# Patient Record
Sex: Female | Born: 1985 | Race: Black or African American | Hispanic: No | Smoking: Former smoker
Health system: Southern US, Community
[De-identification: ages and names within clinical notes are randomized; demographics above are authoritative.]

## PROBLEM LIST (undated history)

## (undated) ENCOUNTER — Emergency Department: Admission: EM | Discharge status: Absent without leave | Payer: No Typology Code available for payment source

## (undated) ENCOUNTER — Emergency Department: Payer: Medicaid Other

## (undated) DIAGNOSIS — O1490 Unspecified pre-eclampsia, unspecified trimester: Secondary | ICD-10-CM

## (undated) HISTORY — PX: DILATION AND CURETTAGE OF UTERUS: SHX78

## (undated) HISTORY — DX: Unspecified pre-eclampsia, unspecified trimester: O14.90

---
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
## 2007-02-09 DIAGNOSIS — O1490 Unspecified pre-eclampsia, unspecified trimester: Secondary | ICD-10-CM

## 2007-02-09 HISTORY — DX: Unspecified pre-eclampsia, unspecified trimester: O14.90

## 2007-08-16 ENCOUNTER — Inpatient Hospital Stay: Payer: Self-pay | Admitting: Obstetrics and Gynecology

## 2008-06-04 ENCOUNTER — Emergency Department: Payer: Self-pay | Admitting: Emergency Medicine

## 2009-07-07 ENCOUNTER — Emergency Department: Payer: Self-pay | Admitting: Internal Medicine

## 2009-07-08 ENCOUNTER — Emergency Department: Payer: Self-pay | Admitting: Emergency Medicine

## 2009-12-23 ENCOUNTER — Observation Stay: Payer: Self-pay | Admitting: Obstetrics and Gynecology

## 2010-01-18 ENCOUNTER — Observation Stay: Payer: Self-pay | Admitting: Obstetrics and Gynecology

## 2010-03-04 ENCOUNTER — Observation Stay: Payer: Self-pay

## 2010-03-07 ENCOUNTER — Observation Stay: Payer: Self-pay | Admitting: Obstetrics and Gynecology

## 2010-03-07 ENCOUNTER — Inpatient Hospital Stay: Payer: Self-pay

## 2010-10-19 ENCOUNTER — Emergency Department: Payer: Self-pay | Admitting: Emergency Medicine

## 2011-01-18 ENCOUNTER — Encounter: Payer: Self-pay | Admitting: Obstetrics & Gynecology

## 2011-03-29 ENCOUNTER — Encounter: Payer: Self-pay | Admitting: Maternal & Fetal Medicine

## 2011-05-25 ENCOUNTER — Inpatient Hospital Stay: Payer: Self-pay

## 2011-05-25 LAB — CBC WITH DIFFERENTIAL/PLATELET
Basophil #: 0 10*3/uL (ref 0.0–0.1)
Eosinophil #: 0.2 10*3/uL (ref 0.0–0.7)
HCT: 34.8 % — ABNORMAL LOW (ref 35.0–47.0)
HGB: 11.1 g/dL — ABNORMAL LOW (ref 12.0–16.0)
Lymphocyte #: 1.7 10*3/uL (ref 1.0–3.6)
Lymphocyte %: 17.4 %
MCHC: 31.8 g/dL — ABNORMAL LOW (ref 32.0–36.0)
Monocyte #: 0.6 x10 3/mm (ref 0.2–0.9)
Neutrophil #: 7.3 10*3/uL — ABNORMAL HIGH (ref 1.4–6.5)
Platelet: 248 10*3/uL (ref 150–440)
RBC: 3.73 10*6/uL — ABNORMAL LOW (ref 3.80–5.20)
RDW: 14.7 % — ABNORMAL HIGH (ref 11.5–14.5)

## 2011-05-27 LAB — HEMATOCRIT: HCT: 33.1 % — ABNORMAL LOW (ref 35.0–47.0)

## 2012-01-10 DIAGNOSIS — IMO0002 Reserved for concepts with insufficient information to code with codable children: Secondary | ICD-10-CM | POA: Insufficient documentation

## 2012-01-10 DIAGNOSIS — R87619 Unspecified abnormal cytological findings in specimens from cervix uteri: Secondary | ICD-10-CM | POA: Insufficient documentation

## 2014-06-18 NOTE — H&P (Signed)
L&D Evaluation:  History:   HPI 29 yo [id number] at 420w0d presenting for scheduled NST today for postdates.  Patient has been seen by HD this pregnancy.  Reports +FM, no LOF, no VB, no ctx.   PNL A+ / ABSC neg / RPR NR / RI / VI / HIV neg / HBsAg neg / ASCUS HPV positive pap / GC/CT neg 03/26/11, GBS positive 04/23/11.  Pregnancy uncomplicated other than occasional EtOH use noted at start of pregnancy, smker, trichomonas positive.    Presents with NST    Patient's Medical History No Chronic Illness    Patient's Surgical History none    Medications Pre Natal Vitamins    Allergies NKDA    Social History tobacco  drugs  prior ecstasy use 07/2010    Family History Non-Contributory   Exam:   Vital Signs stable    General no apparent distress    Mental Status clear    Abdomen gravid, non-tender    Estimated Fetal Weight Average for gestational age    Fetal Position vtx    Pelvic no external lesions, ftp/30/-3    Mebranes Intact    FHT moderate variability, occasional accels, at least 1 variable    Ucx absent   Impression:   Impression NST not quite reactive with variable noted, given postdates and NST not reactive will proceed with IOL   Plan:   Comments - cervadil insert - Amp for GBS ppx   Electronic Signatures: Tasha Macias, Tasha Macias (MD)  (Signed 16-Apr-13 13:30)  Authored: L&D Evaluation   Last Updated: 16-Apr-13 13:30 by Tasha Macias, Tasha Macias (MD)

## 2015-10-16 ENCOUNTER — Emergency Department: Payer: No Typology Code available for payment source

## 2015-10-16 ENCOUNTER — Encounter: Payer: Self-pay | Admitting: Emergency Medicine

## 2015-10-16 ENCOUNTER — Emergency Department
Admission: EM | Admit: 2015-10-16 | Discharge: 2015-10-16 | Disposition: A | Discharge status: Home / Self Care | Payer: No Typology Code available for payment source | Attending: Emergency Medicine | Admitting: Emergency Medicine

## 2015-10-16 DIAGNOSIS — S8011XA Contusion of right lower leg, initial encounter: Secondary | ICD-10-CM

## 2015-10-16 DIAGNOSIS — F17200 Nicotine dependence, unspecified, uncomplicated: Secondary | ICD-10-CM | POA: Insufficient documentation

## 2015-10-16 DIAGNOSIS — S8002XA Contusion of left knee, initial encounter: Secondary | ICD-10-CM | POA: Diagnosis not present

## 2015-10-16 DIAGNOSIS — S161XXA Strain of muscle, fascia and tendon at neck level, initial encounter: Secondary | ICD-10-CM | POA: Insufficient documentation

## 2015-10-16 DIAGNOSIS — Y92410 Unspecified street and highway as the place of occurrence of the external cause: Secondary | ICD-10-CM | POA: Diagnosis not present

## 2015-10-16 DIAGNOSIS — S20219A Contusion of unspecified front wall of thorax, initial encounter: Secondary | ICD-10-CM | POA: Insufficient documentation

## 2015-10-16 DIAGNOSIS — Y9389 Activity, other specified: Secondary | ICD-10-CM | POA: Insufficient documentation

## 2015-10-16 DIAGNOSIS — S199XXA Unspecified injury of neck, initial encounter: Secondary | ICD-10-CM | POA: Diagnosis present

## 2015-10-16 DIAGNOSIS — Y999 Unspecified external cause status: Secondary | ICD-10-CM | POA: Insufficient documentation

## 2015-10-16 DIAGNOSIS — M62838 Other muscle spasm: Secondary | ICD-10-CM

## 2015-10-16 DIAGNOSIS — S8001XA Contusion of right knee, initial encounter: Secondary | ICD-10-CM | POA: Diagnosis not present

## 2015-10-16 DIAGNOSIS — S8012XA Contusion of left lower leg, initial encounter: Secondary | ICD-10-CM

## 2015-10-16 LAB — POCT PREGNANCY, URINE: Preg Test, Ur: NEGATIVE

## 2015-10-16 MED ORDER — NAPROXEN 500 MG PO TABS: 500.0000 mg | ORAL_TABLET | Freq: Two times a day (BID) | ORAL | 0 refills | Status: DC

## 2015-10-16 MED ORDER — CYCLOBENZAPRINE HCL 10 MG PO TABS: 10.0000 mg | ORAL_TABLET | Freq: Three times a day (TID) | ORAL | 0 refills | Status: DC | PRN

## 2015-10-16 MED ORDER — KETOROLAC TROMETHAMINE 30 MG/ML IJ SOLN
30.0000 mg | Freq: Once | INTRAMUSCULAR | Status: AC
  Administered 2015-10-16: 30 mg via INTRAMUSCULAR
  Filled 2015-10-16: qty 1
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
## 2016-03-01 LAB — HM PAP SMEAR: HM Pap smear: NEGATIVE

## 2016-11-12 ENCOUNTER — Ambulatory Visit
Admission: EM | Admit: 2016-11-12 | Discharge: 2016-11-12 | Disposition: A | Discharge status: Home / Self Care | Payer: Self-pay | Attending: Family Medicine | Admitting: Family Medicine

## 2017-07-19 ENCOUNTER — Other Ambulatory Visit: Payer: Self-pay

## 2017-07-19 ENCOUNTER — Emergency Department
Admission: EM | Admit: 2017-07-19 | Discharge: 2017-07-19 | Disposition: A | Discharge status: Home / Self Care | Payer: Self-pay | Attending: Emergency Medicine | Admitting: Emergency Medicine

## 2017-07-19 ENCOUNTER — Encounter: Payer: Self-pay | Admitting: Emergency Medicine

## 2017-07-19 DIAGNOSIS — Y92410 Unspecified street and highway as the place of occurrence of the external cause: Secondary | ICD-10-CM | POA: Insufficient documentation

## 2017-07-19 DIAGNOSIS — Y998 Other external cause status: Secondary | ICD-10-CM | POA: Insufficient documentation

## 2017-07-19 DIAGNOSIS — M25561 Pain in right knee: Secondary | ICD-10-CM | POA: Insufficient documentation

## 2017-07-19 DIAGNOSIS — F17210 Nicotine dependence, cigarettes, uncomplicated: Secondary | ICD-10-CM | POA: Insufficient documentation

## 2017-07-19 DIAGNOSIS — S161XXA Strain of muscle, fascia and tendon at neck level, initial encounter: Secondary | ICD-10-CM | POA: Insufficient documentation

## 2017-07-19 DIAGNOSIS — Y9389 Activity, other specified: Secondary | ICD-10-CM | POA: Insufficient documentation

## 2017-07-19 DIAGNOSIS — M25562 Pain in left knee: Secondary | ICD-10-CM | POA: Insufficient documentation

## 2017-07-19 MED ORDER — CYCLOBENZAPRINE HCL 10 MG PO TABS: 10.0000 mg | ORAL_TABLET | Freq: Three times a day (TID) | ORAL | 0 refills | Status: DC | PRN

## 2017-07-19 MED ORDER — NAPROXEN 500 MG PO TABS: 500.0000 mg | ORAL_TABLET | Freq: Two times a day (BID) | ORAL | 0 refills | Status: DC
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
## 2017-07-27 ENCOUNTER — Emergency Department
Admission: EM | Admit: 2017-07-27 | Discharge: 2017-07-27 | Disposition: A | Discharge status: Home / Self Care | Payer: Self-pay | Attending: Emergency Medicine | Admitting: Emergency Medicine

## 2017-07-27 ENCOUNTER — Emergency Department: Payer: Self-pay

## 2017-07-27 ENCOUNTER — Other Ambulatory Visit: Payer: Self-pay

## 2017-07-27 DIAGNOSIS — M545 Low back pain, unspecified: Secondary | ICD-10-CM

## 2017-07-27 DIAGNOSIS — Z79899 Other long term (current) drug therapy: Secondary | ICD-10-CM | POA: Insufficient documentation

## 2017-07-27 DIAGNOSIS — Z041 Encounter for examination and observation following transport accident: Secondary | ICD-10-CM | POA: Insufficient documentation

## 2017-07-27 DIAGNOSIS — F17210 Nicotine dependence, cigarettes, uncomplicated: Secondary | ICD-10-CM | POA: Insufficient documentation

## 2017-07-27 MED ORDER — MELOXICAM 15 MG PO TABS: 15.0000 mg | ORAL_TABLET | Freq: Every day | ORAL | 0 refills | Status: DC

## 2017-07-27 MED ORDER — ORPHENADRINE CITRATE 30 MG/ML IJ SOLN
60.0000 mg | Freq: Once | INTRAMUSCULAR | Status: AC
  Administered 2017-07-27: 60 mg via INTRAMUSCULAR
  Filled 2017-07-27: qty 2

## 2017-07-27 MED ORDER — KETOROLAC TROMETHAMINE 30 MG/ML IJ SOLN
30.0000 mg | Freq: Once | INTRAMUSCULAR | Status: AC
  Administered 2017-07-27: 30 mg via INTRAMUSCULAR
  Filled 2017-07-27: qty 1
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
## 2017-07-27 NOTE — ED Notes (Signed)
Pt states she is on her menstrual cycle and not pregnant. Happy to sign waiver to have XR. EDP aware. Verbal to cancel POC pregnancy. XR aware.
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
## 2017-10-28 ENCOUNTER — Emergency Department: Payer: Medicaid Other

## 2017-10-28 ENCOUNTER — Emergency Department
Admission: EM | Admit: 2017-10-28 | Discharge: 2017-10-28 | Disposition: A | Discharge status: Home / Self Care | Payer: Medicaid Other | Attending: Emergency Medicine | Admitting: Emergency Medicine

## 2017-10-28 ENCOUNTER — Encounter: Payer: Self-pay | Admitting: Emergency Medicine

## 2017-10-28 DIAGNOSIS — S4991XA Unspecified injury of right shoulder and upper arm, initial encounter: Secondary | ICD-10-CM | POA: Diagnosis not present

## 2017-10-28 DIAGNOSIS — Y998 Other external cause status: Secondary | ICD-10-CM | POA: Insufficient documentation

## 2017-10-28 DIAGNOSIS — Y92410 Unspecified street and highway as the place of occurrence of the external cause: Secondary | ICD-10-CM | POA: Diagnosis not present

## 2017-10-28 DIAGNOSIS — Y93I9 Activity, other involving external motion: Secondary | ICD-10-CM | POA: Insufficient documentation

## 2017-10-28 DIAGNOSIS — S199XXA Unspecified injury of neck, initial encounter: Secondary | ICD-10-CM | POA: Insufficient documentation

## 2017-10-28 DIAGNOSIS — S0990XA Unspecified injury of head, initial encounter: Secondary | ICD-10-CM | POA: Diagnosis present

## 2017-10-28 LAB — BASIC METABOLIC PANEL
Anion gap: 8 (ref 5–15)
BUN: 12 mg/dL (ref 6–20)
CHLORIDE: 108 mmol/L (ref 98–111)
CO2: 23 mmol/L (ref 22–32)
CREATININE: 0.7 mg/dL (ref 0.44–1.00)
Calcium: 8.9 mg/dL (ref 8.9–10.3)
GFR calc non Af Amer: >60 mL/min (ref >60)
Glucose, Bld: 86 mg/dL (ref 70–99)
Potassium: 3.7 mmol/L (ref 3.5–5.1)
Sodium: 139 mmol/L (ref 135–145)

## 2017-10-28 LAB — CBC
HCT: 38.1 % (ref 35.0–47.0)
HEMOGLOBIN: 13.3 g/dL (ref 12.0–16.0)
MCH: 33.7 pg (ref 26.0–34.0)
MCHC: 34.8 g/dL (ref 32.0–36.0)
MCV: 96.7 fL (ref 80.0–100.0)
Platelets: 196 10*3/uL (ref 150–440)
RBC: 3.94 MIL/uL (ref 3.80–5.20)
RDW: 13 % (ref 11.5–14.5)
WBC: 6 10*3/uL (ref 3.6–11.0)

## 2017-10-28 LAB — POCT PREGNANCY, URINE: Preg Test, Ur: NEGATIVE

## 2017-10-28 MED ORDER — CYCLOBENZAPRINE HCL 5 MG PO TABS: ORAL_TABLET | ORAL | 0 refills | Status: DC

## 2017-10-28 MED ORDER — IOPAMIDOL (ISOVUE-300) INJECTION 61%
100.0000 mL | Freq: Once | INTRAVENOUS | Status: AC | PRN
  Administered 2017-10-28: 100 mL via INTRAVENOUS
  Filled 2017-10-28: qty 100

## 2017-10-28 MED ORDER — NAPROXEN 500 MG PO TABS: 500.0000 mg | ORAL_TABLET | Freq: Two times a day (BID) | ORAL | 0 refills | Status: DC
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
## 2017-12-07 LAB — HM HIV SCREENING LAB: HM HIV Screening: NEGATIVE

## 2017-12-26 ENCOUNTER — Emergency Department
Admission: EM | Admit: 2017-12-26 | Discharge: 2017-12-26 | Disposition: A | Discharge status: Home / Self Care | Payer: Medicaid Other | Attending: Emergency Medicine | Admitting: Emergency Medicine

## 2017-12-26 ENCOUNTER — Emergency Department: Payer: Medicaid Other

## 2017-12-26 ENCOUNTER — Encounter: Payer: Self-pay | Admitting: Emergency Medicine

## 2017-12-26 DIAGNOSIS — Z3A01 Less than 8 weeks gestation of pregnancy: Secondary | ICD-10-CM | POA: Diagnosis not present

## 2017-12-26 DIAGNOSIS — O200 Threatened abortion: Secondary | ICD-10-CM | POA: Insufficient documentation

## 2017-12-26 DIAGNOSIS — O99331 Smoking (tobacco) complicating pregnancy, first trimester: Secondary | ICD-10-CM | POA: Insufficient documentation

## 2017-12-26 DIAGNOSIS — R109 Unspecified abdominal pain: Secondary | ICD-10-CM | POA: Diagnosis present

## 2017-12-26 DIAGNOSIS — B9689 Other specified bacterial agents as the cause of diseases classified elsewhere: Secondary | ICD-10-CM

## 2017-12-26 DIAGNOSIS — O23591 Infection of other part of genital tract in pregnancy, first trimester: Secondary | ICD-10-CM | POA: Insufficient documentation

## 2017-12-26 DIAGNOSIS — F17210 Nicotine dependence, cigarettes, uncomplicated: Secondary | ICD-10-CM | POA: Diagnosis not present

## 2017-12-26 DIAGNOSIS — O209 Hemorrhage in early pregnancy, unspecified: Secondary | ICD-10-CM | POA: Diagnosis not present

## 2017-12-26 DIAGNOSIS — Z79899 Other long term (current) drug therapy: Secondary | ICD-10-CM | POA: Insufficient documentation

## 2017-12-26 DIAGNOSIS — N760 Acute vaginitis: Secondary | ICD-10-CM

## 2017-12-26 LAB — URINALYSIS, COMPLETE (UACMP) WITH MICROSCOPIC
Bacteria, UA: NONE SEEN
Bilirubin Urine: NEGATIVE
Glucose, UA: NEGATIVE mg/dL
Hgb urine dipstick: NEGATIVE
KETONES UR: NEGATIVE mg/dL
Leukocytes, UA: NEGATIVE
Nitrite: NEGATIVE
PH: 6 (ref 5.0–8.0)
Protein, ur: NEGATIVE mg/dL
SPECIFIC GRAVITY, URINE: 1.024 (ref 1.005–1.030)

## 2017-12-26 LAB — CBC
HEMATOCRIT: 36.1 % (ref 36.0–46.0)
Hemoglobin: 12.2 g/dL (ref 12.0–15.0)
MCH: 32.7 pg (ref 26.0–34.0)
MCHC: 33.8 g/dL (ref 30.0–36.0)
MCV: 96.8 fL (ref 80.0–100.0)
PLATELETS: 234 10*3/uL (ref 150–400)
RBC: 3.73 MIL/uL — ABNORMAL LOW (ref 3.87–5.11)
RDW: 12 % (ref 11.5–15.5)
WBC: 6.1 10*3/uL (ref 4.0–10.5)
nRBC: 0 % (ref 0.0–0.2)

## 2017-12-26 LAB — COMPREHENSIVE METABOLIC PANEL
ALT: 15 U/L (ref 0–44)
ANION GAP: 7 (ref 5–15)
AST: 14 U/L — AB (ref 15–41)
Albumin: 3.7 g/dL (ref 3.5–5.0)
Alkaline Phosphatase: 55 U/L (ref 38–126)
BUN: 14 mg/dL (ref 6–20)
CALCIUM: 9.2 mg/dL (ref 8.9–10.3)
CO2: 27 mmol/L (ref 22–32)
Chloride: 105 mmol/L (ref 98–111)
Creatinine, Ser: 0.64 mg/dL (ref 0.44–1.00)
GFR calc Af Amer: >60 mL/min (ref >60)
GLUCOSE: 84 mg/dL (ref 70–99)
Potassium: 3.7 mmol/L (ref 3.5–5.1)
Sodium: 139 mmol/L (ref 135–145)
TOTAL PROTEIN: 6.4 g/dL — AB (ref 6.5–8.1)
Total Bilirubin: 0.7 mg/dL (ref 0.3–1.2)

## 2017-12-26 LAB — CHLAMYDIA/NGC RT PCR (ARMC ONLY)
CHLAMYDIA TR: NOT DETECTED
N gonorrhoeae: NOT DETECTED

## 2017-12-26 LAB — WET PREP, GENITAL
Sperm: NONE SEEN
Trich, Wet Prep: NONE SEEN
Yeast Wet Prep HPF POC: NONE SEEN

## 2017-12-26 LAB — HCG, QUANTITATIVE, PREGNANCY: hCG, Beta Chain, Quant, S: 42571 m[IU]/mL — ABNORMAL HIGH (ref <5)

## 2017-12-26 LAB — LIPASE, BLOOD: LIPASE: 39 U/L (ref 11–51)

## 2017-12-26 LAB — POCT PREGNANCY, URINE: Preg Test, Ur: POSITIVE — AB

## 2017-12-26 LAB — ABO/RH: ABO/RH(D): A POS

## 2017-12-26 MED ORDER — METRONIDAZOLE 500 MG PO TABS: 500.0000 mg | ORAL_TABLET | Freq: Two times a day (BID) | ORAL | 0 refills | Status: DC

## 2017-12-26 NOTE — Discharge Instructions (Signed)
Your ultrasound shows a normal-appearing pregnancy within the uterus and a normal location.  Your blood type is A+, and your beta hCG (pregnancy hormone) level is 42,500.  It does appear that you have bacterial vaginosis which can cause inflammation and pain in the pelvis.  Take metronidazole 2 times a day to treat this condition.  Please follow-up with your prenatal care provider in 2 days for repeat blood test and further monitoring of your symptoms.  Results for orders placed or performed during the hospital encounter of 12/26/17  Chlamydia/NGC rt PCR  Result Value Ref Range   Specimen source GC/Chlam ENDOCERVICAL    Chlamydia Tr NOT DETECTED NOT DETECTED   N gonorrhoeae NOT DETECTED NOT DETECTED  Wet prep, genital  Result Value Ref Range   Yeast Wet Prep HPF POC NONE SEEN NONE SEEN   Trich, Wet Prep NONE SEEN NONE SEEN   Clue Cells Wet Prep HPF POC PRESENT (A) NONE SEEN   WBC, Wet Prep HPF POC FEW (A) NONE SEEN   Sperm NONE SEEN   Lipase, blood  Result Value Ref Range   Lipase 39 11 - 51 U/L  Comprehensive metabolic panel  Result Value Ref Range   Sodium 139 135 - 145 mmol/L   Potassium 3.7 3.5 - 5.1 mmol/L   Chloride 105 98 - 111 mmol/L   CO2 27 22 - 32 mmol/L   Glucose, Bld 84 70 - 99 mg/dL   BUN 14 6 - 20 mg/dL   Creatinine, Ser 1.61 0.44 - 1.00 mg/dL   Calcium 9.2 8.9 - 09.6 mg/dL   Total Protein 6.4 (L) 6.5 - 8.1 g/dL   Albumin 3.7 3.5 - 5.0 g/dL   AST 14 (L) 15 - 41 U/L   ALT 15 0 - 44 U/L   Alkaline Phosphatase 55 38 - 126 U/L   Total Bilirubin 0.7 0.3 - 1.2 mg/dL   GFR calc non Af Amer >60 >60 mL/min   GFR calc Af Amer >60 >60 mL/min   Anion gap 7 5 - 15  CBC  Result Value Ref Range   WBC 6.1 4.0 - 10.5 K/uL   RBC 3.73 (L) 3.87 - 5.11 MIL/uL   Hemoglobin 12.2 12.0 - 15.0 g/dL   HCT 04.5 40.9 - 81.1 %   MCV 96.8 80.0 - 100.0 fL   MCH 32.7 26.0 - 34.0 pg   MCHC 33.8 30.0 - 36.0 g/dL   RDW 91.4 78.2 - 95.6 %   Platelets 234 150 - 400 K/uL   nRBC 0.0 0.0 -  0.2 %  Urinalysis, Complete w Microscopic  Result Value Ref Range   Color, Urine YELLOW (A) YELLOW   APPearance CLEAR (A) CLEAR   Specific Gravity, Urine 1.024 1.005 - 1.030   pH 6.0 5.0 - 8.0   Glucose, UA NEGATIVE NEGATIVE mg/dL   Hgb urine dipstick NEGATIVE NEGATIVE   Bilirubin Urine NEGATIVE NEGATIVE   Ketones, ur NEGATIVE NEGATIVE mg/dL   Protein, ur NEGATIVE NEGATIVE mg/dL   Nitrite NEGATIVE NEGATIVE   Leukocytes, UA NEGATIVE NEGATIVE   RBC / HPF 0-5 0 - 5 RBC/hpf   WBC, UA 0-5 0 - 5 WBC/hpf   Bacteria, UA NONE SEEN NONE SEEN   Squamous Epithelial / LPF 0-5 0 - 5   Mucus PRESENT   hCG, quantitative, pregnancy  Result Value Ref Range   hCG, Beta Chain, Quant, S 42,571 (H) <5 mIU/mL  Pregnancy, urine POC  Result Value Ref Range   Preg Test,  Ur POSITIVE (A) NEGATIVE  ABO/Rh  Result Value Ref Range   ABO/RH(D)      A POS Performed at [facility], [address] [place], [place] [address]    [place] Ob Comp < 14 Wks  Result Date: 12/26/2017 CLINICAL DATA:  Five weeks 5 days pregnant with vaginal bleeding. EXAM: OBSTETRIC <14 WK US AND TRANSVAGINAL OB US TECHNIQUE: Both transabdominal and transvaginal ultrasound examinations were performed for complete evaluation of the gestation as well as the maternal uterus, adnexal regions, and pelvic cul-de-sac. Transvaginal technique was performed to assess early pregnancy. COMPARISON:  10/28/2017 CT. FINDINGS: Intrauterine gestational sac: Present Yolk sac:  Present Embryo:  Present Cardiac Activity: Present Heart Rate: 106 bpm CRL:  3 mm   5 w   6 d                  US EDC: 08/22/2018 Subchorionic hemorrhage: Small anterior subchorionic hemorrhage identified at maximally 1.5 cm. Maternal uterus/adnexae: Normal right ovary. Left ovarian corpus luteal cyst of maximally 2.8 cm. Trace free pelvic fluid is likely physiologic. IMPRESSION: 1. Intrauterine pregnancy of approximately 5 weeks 6 days with fetal heart rate of 106 beats per  minute. 2. Small anterior subchorionic hemorrhage. 3. Left ovarian corpus luteal cyst. Electronically Signed   By: Jeronimo GreavesKyle  Talbot M.D.   On: 12/26/2017 17:47   [place] Ob Transvaginal  Result Date: 12/26/2017 CLINICAL DATA:  Five weeks 5 days pregnant with vaginal bleeding. EXAM: OBSTETRIC <14 WK US AND TRANSVAGINAL OB US TECHNIQUE: Both transabdominal and transvaginal ultrasound examinations were performed for complete evaluation of the gestation as well as the maternal uterus, adnexal regions, and pelvic cul-de-sac. Transvaginal technique was performed to assess early pregnancy. COMPARISON:  10/28/2017 CT. FINDINGS: Intrauterine gestational sac: Present Yolk sac:  Present Embryo:  Present Cardiac Activity: Present Heart Rate: 106 bpm CRL:  3 mm   5 w   6 d                  US EDC: 08/22/2018 Subchorionic hemorrhage: Small anterior subchorionic hemorrhage identified at maximally 1.5 cm. Maternal uterus/adnexae: Normal right ovary. Left ovarian corpus luteal cyst of maximally 2.8 cm. Trace free pelvic fluid is likely physiologic. IMPRESSION: 1. Intrauterine pregnancy of approximately 5 weeks 6 days with fetal heart rate of 106 beats per minute. 2. Small anterior subchorionic hemorrhage. 3. Left ovarian corpus luteal cyst. Electronically Signed   By: Jeronimo GreavesKyle  Talbot M.D.   On: 12/26/2017 17:47

## 2017-12-26 NOTE — ED Triage Notes (Signed)
Patient presents to the ED with right upper quadrant pain, right lower quadrant pain and lower back pain x 2 days.  Patient is having some vaginal bleeding/spotting x 4 days.  Patient had a positive pregnancy test today.  Patient also reports feeling tired, nauseous and dizzy.

## 2017-12-26 NOTE — ED Provider Notes (Signed)
[facility] Emergency Department Provider Note  ____________________________________________  Time seen: Approximately 7:17 PM  I have reviewed the triage vital signs and the nursing notes.   HISTORY  Chief Complaint Abdominal Pain    HPI Tasha Macias is a 32 y.o. female with no significant past medical history who complains of diffuse abdominal pain for the past 2 days and vaginal spotting for the past 4 days.  Last menstrual period was November 15, 2017.  She went to the health department found to have positive pregnancy test today, sent to ED for further evaluation.  She does report a small amount of vaginal discharge.  No chest pain shortness of breath fevers or chills.  No vomiting or diarrhea.  Symptoms are waxing waning, no aggravating or alleviating factors, mild to moderate.      History reviewed. No pertinent past medical history.   There are no active problems to display for this patient.    History reviewed. No pertinent surgical history.   Prior to Admission medications   Medication Sig Start Date End Date Taking? Authorizing Provider  cyclobenzaprine (FLEXERIL) 5 MG tablet Take 1-2 tablets 3 times daily as needed 10/28/17   Enid Derry, PA-C  meloxicam (MOBIC) 15 MG tablet Take 1 tablet (15 mg total) by mouth daily. 07/27/17   Cuthriell, Delorise Royals, PA-C  metroNIDAZOLE (FLAGYL) 500 MG tablet Take 1 tablet (500 mg total) by mouth 2 (two) times daily. 12/26/17   Sharman Cheek, MD  naproxen (NAPROSYN) 500 MG tablet Take 1 tablet (500 mg total) by mouth 2 (two) times daily with a meal. 10/28/17 10/28/18  Enid Derry, PA-C     Allergies Patient has no known allergies.   No family history on file.  Social History Social History   Tobacco Use  . Smoking status: Current Every Day Smoker    Packs/day: 0.50  . Smokeless tobacco: Never Used  Substance Use Topics  . Alcohol use: Yes    Alcohol/week: 1.0 standard drinks    Types: 1  Glasses of wine per week    Comment: occ.  . Drug use: No    Review of Systems  Constitutional:   No fever or chills.  ENT:   No sore throat. No rhinorrhea. Cardiovascular:   No chest pain or syncope. Respiratory:   No dyspnea or cough. Gastrointestinal:   Positive as above for abdominal pain without vomiting and diarrhea.  Musculoskeletal:   Negative for focal pain or swelling All other systems reviewed and are negative except as documented above in ROS and HPI.  ____________________________________________   PHYSICAL EXAM:  VITAL SIGNS: ED Triage Vitals  Enc Vitals Group     BP 12/26/17 1424 116/67     Pulse Rate 12/26/17 1424 79     Resp 12/26/17 1424 16     Temp 12/26/17 1424 99 F (37.2 C)     Temp Source 12/26/17 1424 Oral     SpO2 12/26/17 1424 99 %     Weight 12/26/17 1423 163 lb (73.9 kg)     Height 12/26/17 1423 5\' 4"  (1.626 m)     Head Circumference --      Peak Flow --      Pain Score 12/26/17 1422 0     Pain Loc --      Pain Edu? --      Excl. in GC? --     Vital signs reviewed, nursing assessments reviewed.   Constitutional:   Alert and oriented. Non-toxic  appearance. Eyes:   Conjunctivae are normal. EOMI. PERRL. ENT      Head:   Normocephalic and atraumatic.      Nose:   No congestion/rhinnorhea.       Mouth/Throat:   MMM, no pharyngeal erythema. No peritonsillar mass.       Neck:   No meningismus. Full ROM. Hematological/Lymphatic/Immunilogical:   No cervical lymphadenopathy. Cardiovascular:   RRR. Symmetric bilateral radial and DP pulses.  No murmurs. Cap refill less than 2 seconds. Respiratory:   Normal respiratory effort without tachypnea/retractions. Breath sounds are clear and equal bilaterally. No wheezes/rales/rhonchi. Gastrointestinal:   Soft and nontender. Non distended. There is no CVA tenderness.  No rebound, rigidity, or guarding. Genitourinary:   Pelvic exam performed with nurse Clinton SawyerKailey at bedside, external exam unremarkable, speculum  exam shows this small amount of discharge in the vault, nonpurulent.  No apparent cervicitis. Musculoskeletal:   Normal range of motion in all extremities. No joint effusions.  No lower extremity tenderness.  No edema. Neurologic:   Normal speech and language.  Motor grossly intact. No acute focal neurologic deficits are appreciated.  Skin:    Skin is warm, dry and intact. No rash noted.  No petechiae, purpura, or bullae.  ____________________________________________    LABS (pertinent positives/negatives) (all labs ordered are listed, but only abnormal results are displayed) Labs Reviewed  WET PREP, GENITAL - Abnormal; Notable for the following components:      Result Value   Clue Cells Wet Prep HPF POC PRESENT (*)    WBC, Wet Prep HPF POC FEW (*)    All other components within normal limits  COMPREHENSIVE METABOLIC PANEL - Abnormal; Notable for the following components:   Total Protein 6.4 (*)    AST 14 (*)    All other components within normal limits  CBC - Abnormal; Notable for the following components:   RBC 3.73 (*)    All other components within normal limits  URINALYSIS, COMPLETE (UACMP) WITH MICROSCOPIC - Abnormal; Notable for the following components:   Color, Urine YELLOW (*)    APPearance CLEAR (*)    All other components within normal limits  HCG, QUANTITATIVE, PREGNANCY - Abnormal; Notable for the following components:   hCG, Beta Chain, Quant, S 42,571 (*)    All other components within normal limits  POCT PREGNANCY, URINE - Abnormal; Notable for the following components:   Preg Test, Ur POSITIVE (*)    All other components within normal limits  CHLAMYDIA/NGC RT PCR ([facility] ONLY)  LIPASE, BLOOD  POC URINE PREG, ED  ABO/RH   ____________________________________________   EKG    ____________________________________________    RADIOLOGY  [place] Ob Comp < 14 Wks  Result Date: 12/26/2017 CLINICAL DATA:  Five weeks 5 days pregnant with vaginal  bleeding. EXAM: OBSTETRIC <14 WK US AND TRANSVAGINAL OB US TECHNIQUE: Both transabdominal and transvaginal ultrasound examinations were performed for complete evaluation of the gestation as well as the maternal uterus, adnexal regions, and pelvic cul-de-sac. Transvaginal technique was performed to assess early pregnancy. COMPARISON:  10/28/2017 CT. FINDINGS: Intrauterine gestational sac: Present Yolk sac:  Present Embryo:  Present Cardiac Activity: Present Heart Rate: 106 bpm CRL:  3 mm   5 w   6 d                  US EDC: 08/22/2018 Subchorionic hemorrhage: Small anterior subchorionic hemorrhage identified at maximally 1.5 cm. Maternal uterus/adnexae: Normal right ovary. Left ovarian corpus luteal cyst of maximally 2.8 cm.  Trace free pelvic fluid is likely physiologic. IMPRESSION: 1. Intrauterine pregnancy of approximately 5 weeks 6 days with fetal heart rate of 106 beats per minute. 2. Small anterior subchorionic hemorrhage. 3. Left ovarian corpus luteal cyst. Electronically Signed   By: Jeronimo Greaves M.D.   On: 12/26/2017 17:47   US Ob Transvaginal  Result Date: 12/26/2017 CLINICAL DATA:  Five weeks 5 days pregnant with vaginal bleeding. EXAM: OBSTETRIC <14 WK [place] AND TRANSVAGINAL OB US TECHNIQUE: Both transabdominal and transvaginal ultrasound examinations were performed for complete evaluation of the gestation as well as the maternal uterus, adnexal regions, and pelvic cul-de-sac. Transvaginal technique was performed to assess early pregnancy. COMPARISON:  10/28/2017 CT. FINDINGS: Intrauterine gestational sac: Present Yolk sac:  Present Embryo:  Present Cardiac Activity: Present Heart Rate: 106 bpm CRL:  3 mm   5 w   6 d                  [place] EDC: 08/22/2018 Subchorionic hemorrhage: Small anterior subchorionic hemorrhage identified at maximally 1.5 cm. Maternal uterus/adnexae: Normal right ovary. Left ovarian corpus luteal cyst of maximally 2.8 cm. Trace free pelvic fluid is likely physiologic. IMPRESSION: 1.  Intrauterine pregnancy of approximately 5 weeks 6 days with fetal heart rate of 106 beats per minute. 2. Small anterior subchorionic hemorrhage. 3. Left ovarian corpus luteal cyst. Electronically Signed   By: Jeronimo Greaves M.D.   On: 12/26/2017 17:47    ____________________________________________   PROCEDURES Procedures  ____________________________________________  DIFFERENTIAL DIAGNOSIS   Threatened miscarriage, ectopic pregnancy, STI, bacterial vaginosis  CLINICAL IMPRESSION / ASSESSMENT AND PLAN / ED COURSE  Pertinent labs & imaging results that were available during my care of the patient were reviewed by me and considered in my medical decision making (see chart for details).    Patient presents with vaginal bleeding in first trimester pregnancy.  Beta hCG is appropriate, 42,000.  Blood type is Rh+.  Ultrasound shows viable IUP.  GC chlamydia negative, positive for clue cells.  Will treat with Flagyl twice daily, follow-up with prenatal care.      ____________________________________________   FINAL CLINICAL IMPRESSION(S) / ED DIAGNOSES    Final diagnoses:  Vaginal bleeding in pregnancy, first trimester  Bacterial vaginosis  Threatened miscarriage   ED Discharge Orders         Ordered    metroNIDAZOLE (FLAGYL) 500 MG tablet  2 times daily     12/26/17 1914          Portions of this note were generated with dragon dictation software. Dictation errors may occur despite best attempts at proofreading.    Sharman Cheek, MD 12/26/17 Jerene Bears

## 2018-02-08 NOTE — L&D Delivery Note (Signed)
Delivery Note  1450 In room to see patient, reports pelvic pressure and the urge to push. SVE: 10/100/-2, vertex.   Effective maternal pushing efforts noted. Spontaneous vaginal birth of live born female infant at 50 in occiput anterior position. Infant immediately to maternal abdomen. Delayed cord clamping. Skin to skin. APGARs: 8, 9. Weight: 3330 g (7 ob 5.5 oz). Receiving nurse present at bedside for birth.   Pitocin infusing. In and out catheter completed using sterile technique. Manual extraction of intact placenta at 1522 after small separation of cord with gentle traction. Uterus firm. Lochia small. Perineum intact. Vault check completed. Counts correct x 2. QBL: 375 ml.   Initiate routine postpartum care and orders. Mom to postpartum.  Baby to Couplet care / Skin to Skin.  FOB present at bedside and overjoyed with the birth of "Tasha Macias".    Diona Fanti, CNM [facility], [facility] 08/29/2018, 3:33 PM

## 2018-02-13 ENCOUNTER — Ambulatory Visit (INDEPENDENT_AMBULATORY_CARE_PROVIDER_SITE_OTHER): Payer: Medicaid Other | Admitting: Certified Nurse Midwife

## 2018-02-13 ENCOUNTER — Encounter: Payer: Self-pay | Admitting: Certified Nurse Midwife

## 2018-02-13 VITALS — BP 110/69 | HR 95 | Wt 164.4 lb

## 2018-02-13 DIAGNOSIS — Z113 Encounter for screening for infections with a predominantly sexual mode of transmission: Secondary | ICD-10-CM

## 2018-02-13 DIAGNOSIS — Z3491 Encounter for supervision of normal pregnancy, unspecified, first trimester: Secondary | ICD-10-CM

## 2018-02-13 DIAGNOSIS — Z3A13 13 weeks gestation of pregnancy: Secondary | ICD-10-CM

## 2018-02-13 DIAGNOSIS — O09291 Supervision of pregnancy with other poor reproductive or obstetric history, first trimester: Secondary | ICD-10-CM | POA: Diagnosis not present

## 2018-02-13 DIAGNOSIS — Z0283 Encounter for blood-alcohol and blood-drug test: Secondary | ICD-10-CM

## 2018-02-13 DIAGNOSIS — Z8759 Personal history of other complications of pregnancy, childbirth and the puerperium: Secondary | ICD-10-CM

## 2018-02-13 LAB — OB RESULTS CONSOLE VARICELLA ZOSTER ANTIBODY, IGG: Varicella: IMMUNE

## 2018-02-13 NOTE — Patient Instructions (Addendum)
How a Baby Grows During Pregnancy  Pregnancy begins when a female's sperm enters a female's egg (fertilization). Fertilization usually happens in one of the tubes (fallopian tubes) that connect the ovaries to the womb (uterus). The fertilized egg moves down the fallopian tube to the uterus. Once it reaches the uterus, it implants into the lining of the uterus and begins to grow. For the first 10 weeks, the fertilized egg is called an embryo. After 10 weeks, it is called a fetus. As the fetus continues to grow, it receives oxygen and nutrients through tissue (placenta) that grows to support the developing baby. The placenta is the life support system for the baby. It provides oxygen and nutrition and removes waste. Learning as much as you can about your pregnancy and how your baby is developing can help you enjoy the experience. It can also make you aware of when there might be a problem and when to ask questions. How long does a typical pregnancy last? A pregnancy usually lasts 280 days, or about 40 weeks. Pregnancy is divided into three periods of growth, also called trimesters:  First trimester: 0-12 weeks.  Second trimester: 13-27 weeks.  Third trimester: 28-40 weeks. The day when your baby is ready to be born (full term) is your estimated date of delivery. How does my baby develop month by month? First month  The fertilized egg attaches to the inside of the uterus.  Some cells will form the placenta. Others will form the fetus.  The arms, legs, brain, spinal cord, lungs, and heart begin to develop.  At the end of the first month, the heart begins to beat. Second month  The bones, inner ear, eyelids, hands, and feet form.  The genitals develop.  By the end of 8 weeks, all major organs are developing. Third month  All of the internal organs are forming.  Teeth develop below the gums.  Bones and muscles begin to grow. The spine can flex.  The skin is  transparent.  Fingernails and toenails begin to form.  Arms and legs continue to grow longer, and hands and feet develop.  The fetus is about 3 inches (7.6 cm) long. Fourth month  The placenta is completely formed.  The external sex organs, neck, outer ear, eyebrows, eyelids, and fingernails are formed.  The fetus can hear, swallow, and move its arms and legs.  The kidneys begin to produce urine.  The skin is covered with a white, waxy coating (vernix) and very fine hair (lanugo). Fifth month  The fetus moves around more and can be felt for the first time (quickening).  The fetus starts to sleep and wake up and may begin to suck its finger.  The nails grow to the end of the fingers.  The organ in the digestive system that makes bile (gallbladder) functions and helps to digest nutrients.  If your baby is a girl, eggs are present in her ovaries. If your baby is a boy, testicles start to move down into his scrotum. Sixth month  The lungs are formed.  The eyes open. The brain continues to develop.  Your baby has fingerprints and toe prints. Your baby's hair grows thicker.  At the end of the second trimester, the fetus is about 9 inches (22.9 cm) long. Seventh month  The fetus kicks and stretches.  The eyes are developed enough to sense changes in light.  The hands can make a grasping motion.  The fetus responds to sound. Eighth month  All organs and body systems are fully developed and functioning.  Bones harden, and taste buds develop. The fetus may hiccup.  Certain areas of the brain are still developing. The skull remains soft. Ninth month  The fetus gains about  lb (0.23 kg) each week.  The lungs are fully developed.  Patterns of sleep develop.  The fetus's head typically moves into a head-down position (vertex) in the uterus to prepare for birth.  The fetus weighs 6-9 lb (2.72-4.08 kg) and is 19-20 inches (48.26-50.8 cm) long. What can I do to have a  healthy pregnancy and help my baby develop? General instructions  Take prenatal vitamins as directed by your health care provider. These include vitamins such as folic acid, iron, calcium, and vitamin D. They are important for healthy development.  Take medicines only as directed by your health care provider. Read labels and ask a pharmacist or your health care provider whether over-the-counter medicines, supplements, and prescription drugs are safe to take during pregnancy.  Keep all follow-up visits as directed by your health care provider. This is important. Follow-up visits include prenatal care and screening tests. How do I know if my baby is developing well? At each prenatal visit, your health care provider will do several different tests to check on your health and keep track of your baby's development. These include:  Fundal height and position. ? Your health care provider will measure your growing belly from your pubic bone to the top of the uterus using a tape measure. ? Your health care provider will also feel your belly to determine your baby's position.  Heartbeat. ? An ultrasound in the first trimester can confirm pregnancy and show a heartbeat, depending on how far along you are. ? Your health care provider will check your baby's heart rate at every prenatal visit.  Second trimester ultrasound. ? This ultrasound checks your baby's development. It also may show your baby's gender. What should I do if I have concerns about my baby's development? Always talk with your health care provider about any concerns that you may have about your pregnancy and your baby. Summary  A pregnancy usually lasts 280 days, or about 40 weeks. Pregnancy is divided into three periods of growth, also called trimesters.  Your health care provider will monitor your baby's growth and development throughout your pregnancy.  Follow your health care provider's recommendations about taking prenatal  vitamins and medicines during your pregnancy.  Talk with your health care provider if you have any concerns about your pregnancy or your developing baby. This information is not intended to replace advice given to you by your health care provider. Make sure you discuss any questions you have with your health care provider. Document Released: 07/14/2007 Document Revised: 12/08/2016 Document Reviewed: 12/08/2016 Elsevier Interactive Patient Education  2019 [place] of Pregnancy The first trimester of pregnancy is from week 1 until the end of week 13 (months 1 through 3). A week after a sperm fertilizes an egg, the egg will implant on the wall of the uterus. This embryo will begin to develop into a baby. Genes from you and your partner will form the baby. The female genes will determine whether the baby will be a boy or a girl. At 6-8 weeks, the eyes and face will be formed, and the heartbeat can be seen on ultrasound. At the end of 12 weeks, all the baby's organs will be formed. Now that you are pregnant, you will want  to do everything you can to have a healthy baby. Two of the most important things are to get good prenatal care and to follow your health care provider's instructions. Prenatal care is all the medical care you receive before the baby's birth. This care will help prevent, find, and treat any problems during the pregnancy and childbirth. Body changes during your first trimester Your body goes through many changes during pregnancy. The changes vary from woman to woman.  You may gain or lose a couple of pounds at first.  You may feel sick to your stomach (nauseous) and you may throw up (vomit). If the vomiting is uncontrollable, call your health care provider.  You may tire easily.  You may develop headaches that can be relieved by medicines. All medicines should be approved by your health care provider.  You may urinate more often. Painful urination may mean you  have a bladder infection.  You may develop heartburn as a result of your pregnancy.  You may develop constipation because certain hormones are causing the muscles that push stool through your intestines to slow down.  You may develop hemorrhoids or swollen veins (varicose veins).  Your breasts may begin to grow larger and become tender. Your nipples may stick out more, and the tissue that surrounds them (areola) may become darker.  Your gums may bleed and may be sensitive to brushing and flossing.  Dark spots or blotches (chloasma, mask of pregnancy) may develop on your face. This will likely fade after the baby is born.  Your menstrual periods will stop.  You may have a loss of appetite.  You may develop cravings for certain kinds of food.  You may have changes in your emotions from day to day, such as being excited to be pregnant or being concerned that something may go wrong with the pregnancy and baby.  You may have more vivid and strange dreams.  You may have changes in your hair. These can include thickening of your hair, rapid growth, and changes in texture. Some women also have hair loss during or after pregnancy, or hair that feels dry or thin. Your hair will most likely return to normal after your baby is born. What to expect at prenatal visits During a routine prenatal visit:  You will be weighed to make sure you and the baby are growing normally.  Your blood pressure will be taken.  Your abdomen will be measured to track your baby's growth.  The fetal heartbeat will be listened to between weeks 10 and 14 of your pregnancy.  Test results from any previous visits will be discussed. Your health care provider may ask you:  How you are feeling.  If you are feeling the baby move.  If you have had any abnormal symptoms, such as leaking fluid, bleeding, severe headaches, or abdominal cramping.  If you are using any tobacco products, including cigarettes, chewing  tobacco, and electronic cigarettes.  If you have any questions. Other tests that may be performed during your first trimester include:  Blood tests to find your blood type and to check for the presence of any previous infections. The tests will also be used to check for low iron levels (anemia) and protein on red blood cells (Rh antibodies). Depending on your risk factors, or if you previously had diabetes during pregnancy, you may have tests to check for high blood sugar that affects pregnant women (gestational diabetes).  Urine tests to check for infections, diabetes, or protein in the  urine.  An ultrasound to confirm the proper growth and development of the baby.  Fetal screens for spinal cord problems (spina bifida) and Down syndrome.  HIV (human immunodeficiency virus) testing. Routine prenatal testing includes screening for HIV, unless you choose not to have this test.  You may need other tests to make sure you and the baby are doing well. Follow these instructions at home: Medicines  Follow your health care provider's instructions regarding medicine use. Specific medicines may be either safe or unsafe to take during pregnancy.  Take a prenatal vitamin that contains at least 600 micrograms (mcg) of folic acid.  If you develop constipation, try taking a stool softener if your health care provider approves. Eating and drinking   Eat a balanced diet that includes fresh fruits and vegetables, whole grains, good sources of protein such as meat, eggs, or tofu, and low-fat dairy. Your health care provider will help you determine the amount of weight gain that is right for you.  Avoid raw meat and uncooked cheese. These carry germs that can cause birth defects in the baby.  Eating four or five small meals rather than three large meals a day may help relieve nausea and vomiting. If you start to feel nauseous, eating a few soda crackers can be helpful. Drinking liquids between meals,  instead of during meals, also seems to help ease nausea and vomiting.  Limit foods that are high in fat and processed sugars, such as fried and sweet foods.  To prevent constipation: ? Eat foods that are high in fiber, such as fresh fruits and vegetables, whole grains, and beans. ? Drink enough fluid to keep your urine clear or pale yellow. Activity  Exercise only as directed by your health care provider. Most women can continue their usual exercise routine during pregnancy. Try to exercise for 30 minutes at least 5 days a week. Exercising will help you: ? Control your weight. ? Stay in shape. ? Be prepared for labor and delivery.  Experiencing pain or cramping in the lower abdomen or lower back is a good sign that you should stop exercising. Check with your health care provider before continuing with normal exercises.  Try to avoid standing for long periods of time. Move your legs often if you must stand in one place for a long time.  Avoid heavy lifting.  Wear low-heeled shoes and practice good posture.  You may continue to have sex unless your health care provider tells you not to. Relieving pain and discomfort  Wear a good support bra to relieve breast tenderness.  Take warm sitz baths to soothe any pain or discomfort caused by hemorrhoids. Use hemorrhoid cream if your health care provider approves.  Rest with your legs elevated if you have leg cramps or low back pain.  If you develop varicose veins in your legs, wear support hose. Elevate your feet for 15 minutes, 3-4 times a day. Limit salt in your diet. Prenatal care  Schedule your prenatal visits by the twelfth week of pregnancy. They are usually scheduled monthly at first, then more often in the last 2 months before delivery.  Write down your questions. Take them to your prenatal visits.  Keep all your prenatal visits as told by your health care provider. This is important. Safety  Wear your seat belt at all times  when driving.  Make a list of emergency phone numbers, including numbers for family, friends, the hospital, and police and fire departments. General instructions  Ask your  health care provider for a referral to a local prenatal education class. Begin classes no later than the beginning of month 6 of your pregnancy.  Ask for help if you have counseling or nutritional needs during pregnancy. Your health care provider can offer advice or refer you to specialists for help with various needs.  Do not use hot tubs, steam rooms, or saunas.  Do not douche or use tampons or scented sanitary pads.  Do not cross your legs for long periods of time.  Avoid cat litter boxes and soil used by cats. These carry germs that can cause birth defects in the baby and possibly loss of the fetus by miscarriage or stillbirth.  Avoid all smoking, herbs, alcohol, and medicines not prescribed by your health care provider. Chemicals in these products affect the formation and growth of the baby.  Do not use any products that contain nicotine or tobacco, such as cigarettes and e-cigarettes. If you need help quitting, ask your health care provider. You may receive counseling support and other resources to help you quit.  Schedule a dentist appointment. At home, brush your teeth with a soft toothbrush and be gentle when you floss. Contact a health care provider if:  You have dizziness.  You have mild pelvic cramps, pelvic pressure, or nagging pain in the abdominal area.  You have persistent nausea, vomiting, or diarrhea.  You have a bad smelling vaginal discharge.  You have pain when you urinate.  You notice increased swelling in your face, hands, legs, or ankles.  You are exposed to fifth disease or chickenpox.  You are exposed to [place] measles (rubella) and have never had it. Get help right away if:  You have a fever.  You are leaking fluid from your vagina.  You have spotting or bleeding from your  vagina.  You have severe abdominal cramping or pain.  You have rapid weight gain or loss.  You vomit blood or material that looks like coffee grounds.  You develop a severe headache.  You have shortness of breath.  You have any kind of trauma, such as from a fall or a car accident. Summary  The first trimester of pregnancy is from week 1 until the end of week 13 (months 1 through 3).  Your body goes through many changes during pregnancy. The changes vary from woman to woman.  You will have routine prenatal visits. During those visits, your health care provider will examine you, discuss any test results you may have, and talk with you about how you are feeling. This information is not intended to replace advice given to you by your health care provider. Make sure you discuss any questions you have with your health care provider. Document Released: 01/19/2001 Document Revised: 01/07/2016 Document Reviewed: 01/07/2016 Elsevier Interactive Patient Education  2019 [place].   Round Ligament Pain  The round ligament is a cord of muscle and tissue that helps support the uterus. It can become a source of pain during pregnancy if it becomes stretched or twisted as the baby grows. The pain usually begins in the second trimester (13-28 weeks) of pregnancy, and it can come and go until the baby is delivered. It is not a serious problem, and it does not cause harm to the baby. Round ligament pain is usually a short, sharp, and pinching pain, but it can also be a dull, lingering, and aching pain. The pain is felt in the lower side of the abdomen or in the groin. It  usually starts deep in the groin and moves up to the outside of the hip area. The pain may occur when you:  Suddenly change position, such as quickly going from a sitting to standing position.  Roll over in bed.  Cough or sneeze.  Do physical activity. Follow these instructions at home:   Watch your condition for any  changes.  When the pain starts, relax. Then try any of these methods to help with the pain: ? Sitting down. ? Flexing your knees up to your abdomen. ? Lying on your side with one pillow under your abdomen and another pillow between your legs. ? Sitting in a warm bath for 15-20 minutes or until the pain goes away.  Take over-the-counter and prescription medicines only as told by your health care provider.  Move slowly when you sit down or stand up.  Avoid long walks if they cause pain.  Stop or reduce your physical activities if they cause pain.  Keep all follow-up visits as told by your health care provider. This is important. Contact a health care provider if:  Your pain does not go away with treatment.  You feel pain in your back that you did not have before.  Your medicine is not helping. Get help right away if:  You have a fever or chills.  You develop uterine contractions.  You have vaginal bleeding.  You have nausea or vomiting.  You have diarrhea.  You have pain when you urinate. Summary  Round ligament pain is felt in the lower abdomen or groin. It is usually a short, sharp, and pinching pain. It can also be a dull, lingering, and aching pain.  This pain usually begins in the second trimester (13-28 weeks). It occurs because the uterus is stretching with the growing baby, and it is not harmful to the baby.  You may notice the pain when you suddenly change position, when you cough or sneeze, or during physical activity.  Relaxing, flexing your knees to your abdomen, lying on one side, or taking a warm bath may help to get rid of the pain.  Get help from your health care provider if the pain does not go away or if you have vaginal bleeding, nausea, vomiting, diarrhea, or painful urination. This information is not intended to replace advice given to you by your health care provider. Make sure you discuss any questions you have with your health care  provider. Document Released: 11/04/2007 Document Revised: 07/13/2017 Document Reviewed: 07/13/2017 Elsevier Interactive Patient Education  2019 [place].  Abdominal Pain During Pregnancy  Belly (abdominal) pain is common during pregnancy. There are many possible causes. Most of the time, it is not a serious problem. Other times, it can be a sign that something is wrong with the pregnancy. Always tell your doctor if you have belly pain. Follow these instructions at home:  Do not have sex or put anything in your vagina until your pain goes away completely.  Get plenty of rest until your pain gets better.  Drink enough fluid to keep your pee (urine) pale yellow.  Take over-the-counter and prescription medicines only as told by your doctor.  Keep all follow-up visits as told by your doctor. This is important. Contact a doctor if:  Your pain continues or gets worse after resting.  You have lower belly pain that: ? Comes and goes at regular times. ? Spreads to your back. ? Feels like menstrual cramps.  You have pain or burning when you pee (  urinate). Get help right away if:  You have a fever or chills.  You have vaginal bleeding.  You are leaking fluid from your vagina.  You are passing tissue from your vagina.  You throw up (vomit) for more than 24 hours.  You have watery poop (diarrhea) for more than 24 hours.  Your baby is moving less than usual.  You feel very weak or faint.  You have shortness of breath.  You have very bad pain in your upper belly. Summary  Belly (abdominal) pain is common during pregnancy. There are many possible causes.  If you have belly pain during pregnancy, tell your doctor right away.  Keep all follow-up visits as told by your doctor. This is important. This information is not intended to replace advice given to you by your health care provider. Make sure you discuss any questions you have with your health care provider. Document  Released: 01/13/2009 Document Revised: 04/29/2016 Document Reviewed: 04/29/2016 Elsevier Interactive Patient Education  2019 [employer]  Back Pain in Pregnancy Back pain during pregnancy is common. Back pain may be caused by several factors that are related to changes during your pregnancy. Follow these instructions at home: Managing pain, stiffness, and swelling      If directed, for sudden (acute) back pain, put ice on the painful area. ? Put ice in a plastic bag. ? Place a towel between your skin and the bag. ? Leave the ice on for 20 minutes, 2-3 times per day.  If directed, apply heat to the affected area before you exercise. Use the heat source that your health care provider recommends, such as a moist heat pack or a heating pad. ? Place a towel between your skin and the heat source. ? Leave the heat on for 20-30 minutes. ? Remove the heat if your skin turns bright red. This is especially important if you are unable to feel pain, heat, or cold. You may have a greater risk of getting burned.  If directed, massage the affected area. Activity  Exercise as told by your health care provider. Gentle exercise is the best way to prevent or manage back pain.  Listen to your body when lifting. If lifting hurts, ask for help or bend your knees. This uses your leg muscles instead of your back muscles.  Squat down when picking up something from the floor. Do not bend over.  Only use bed rest for short periods as told by your health care provider. Bed rest should only be used for the most severe episodes of back pain. Standing, sitting, and lying down  Do not stand in one place for long periods of time.  Use good posture when sitting. Make sure your head rests over your shoulders and is not hanging forward. Use a pillow on your lower back if necessary.  Try sleeping on your side, preferably the left side, with a pregnancy support pillow or 1-2 regular pillows between your legs. ? If  you have back pain after a night's rest, your bed may be too soft. ? A firm mattress may provide more support for your back during pregnancy. General instructions  Do not wear high heels.  Eat a healthy diet. Try to gain weight within your health care provider's recommendations.  Use a maternity girdle, elastic sling, or back brace as told by your health care provider.  Take over-the-counter and prescription medicines only as told by your health care provider.  Work with a physical therapist or massage therapist  to find ways to manage back pain. Acupuncture or massage therapy may be helpful.  Keep all follow-up visits as told by your health care provider. This is important. Contact a health care provider if:  Your back pain interferes with your daily activities.  You have increasing pain in other parts of your body. Get help right away if:  You develop numbness, tingling, weakness, or problems with the use of your arms or legs.  You develop severe back pain that is not controlled with medicine.  You have a change in bowel or bladder control.  You develop shortness of breath, dizziness, or you faint.  You develop nausea, vomiting, or sweating.  You have back pain that is a rhythmic, cramping pain similar to labor pains. Labor pain is usually 1-2 minutes apart, lasts for about 1 minute, and involves a bearing down feeling or pressure in your pelvis.  You have back pain and your water breaks or you have vaginal bleeding.  You have back pain or numbness that travels down your leg.  Your back pain developed after you fell.  You develop pain on one side of your back.  You see blood in your urine.  You develop skin blisters in the area of your back pain. Summary  Back pain may be caused by several factors that are related to changes during your pregnancy.  Follow instructions as told by your health care provider for managing pain, stiffness, and swelling.  Exercise as told  by your health care provider. Gentle exercise is the best way to prevent or manage back pain.  Take over-the-counter and prescription medicines only as told by your health care provider.  Keep all follow-up visits as told by your health care provider. This is important. This information is not intended to replace advice given to you by your health care provider. Make sure you discuss any questions you have with your health care provider. Document Released: 05/05/2005 Document Revised: 07/13/2017 Document Reviewed: 07/13/2017 Elsevier Interactive Patient Education  2019 [employer]. Common Medications Safe in Pregnancy  Acne:      Constipation:  Benzoyl Peroxide     Colace  Clindamycin      Dulcolax Suppository  Topica Erythromycin     Fibercon  Salicylic Acid      Metamucil         Miralax AVOID:        Senakot   Accutane    Cough:  Retin-A       Cough Drops  Tetracycline      Phenergan w/ Codeine if Rx  Minocycline      Robitussin (Plain & DM)  Antibiotics:     Crabs/Lice:  Ceclor       RID  Cephalosporins    AVOID:  E-Mycins      Kwell  Keflex  Macrobid/Macrodantin   Diarrhea:  Penicillin      Kao-Pectate  Zithromax      Imodium AD         PUSH FLUIDS AVOID:       Cipro     Fever:  Tetracycline      Tylenol (Regular or Extra  Minocycline       Strength)  Levaquin      Extra Strength-Do not          Exceed 8 tabs/24 hrs Caffeine:        <27m/day (equiv. To 1 cup of coffee or  approx. 3 12 oz sodas)  Gas: Cold/Hayfever:       Gas-X  Benadryl      Mylicon  Claritin       Phazyme  **Claritin-D        Chlor-Trimeton    Headaches:  Dimetapp      ASA-Free Excedrin  Drixoral-Non-Drowsy     Cold Compress  Mucinex (Guaifenasin)     Tylenol (Regular or Extra  Sudafed/Sudafed-12 Hour     Strength)  **Sudafed PE Pseudoephedrine   Tylenol Cold & Sinus     Vicks Vapor Rub  Zyrtec  **AVOID if Problems With Blood Pressure         Heartburn: Avoid lying down  for at least 1 hour after meals  Aciphex      Maalox     Rash:  Milk of Magnesia     Benadryl    Mylanta       1% Hydrocortisone Cream  Pepcid  Pepcid Complete   Sleep Aids:  Prevacid      Ambien   Prilosec       Benadryl  Rolaids       Chamomile Tea  Tums (Limit 4/day)     Unisom  Zantac       Tylenol PM         Warm milk-add vanilla or  Hemorrhoids:       Sugar for taste  Anusol/Anusol H.C.  (RX: Analapram 2.5%)  Sugar Substitutes:  Hydrocortisone OTC     Ok in moderation  Preparation H      Tucks        Vaseline lotion applied to tissue with wiping    Herpes:     Throat:  Acyclovir      Oragel  Famvir  Valtrex     Vaccines:         Flu Shot Leg Cramps:       *Gardasil  Benadryl      Hepatitis A         Hepatitis B Nasal Spray:       Pneumovax  Saline Nasal Spray     Polio Booster         Tetanus Nausea:       Tuberculosis test or PPD  Vitamin B6 25 mg TID   AVOID:    Dramamine      *Gardasil  Emetrol       Live Poliovirus  Ginger Root 250 mg QID    MMR (measles, mumps &  High Complex Carbs @ Bedtime    rebella)  Sea Bands-Accupressure    Varicella (Chickenpox)  Unisom 1/2 tab TID     *No known complications           If received before Pain:         Known pregnancy;   Darvocet       Resume series after  Lortab        Delivery  Percocet    Yeast:   Tramadol      Femstat  Tylenol 3      Gyne-lotrimin  Ultram       Monistat  Vicodin           MISC:         All Sunscreens           Hair Coloring/highlights          Insect Repellant's          (Including DEET)         Mystic Tans  Eating Plan for Pregnant Women While you are pregnant, your body requires additional nutrition to help support your growing baby. You also have a higher need for some vitamins and minerals, such as folic acid, calcium, iron, and vitamin D. Eating a healthy, well-balanced diet is very important for your health and your baby's health. Your need for extra calories varies for the three  39-monthsegments of your pregnancy (trimesters). For most women, it is recommended to consume:  150 extra calories a day during the first trimester.  300 extra calories a day during the second trimester.  300 extra calories a day during the third trimester. What are tips for following this plan?   Do not try to lose weight or go on a diet during pregnancy.  Limit your overall intake of foods that have "empty calories." These are foods that have little nutritional value, such as sweets, desserts, candies, and sugar-sweetened beverages.  Eat a variety of foods (especially fruits and vegetables) to get a full range of vitamins and minerals.  Take a prenatal vitamin to help meet your additional vitamin and mineral needs during pregnancy, specifically for folic acid, iron, calcium, and vitamin D.  Remember to stay active. Ask your health care provider what types of exercise and activities are safe for you.  Practice good food safety and cleanliness. Wash your hands before you eat and after you prepare raw meat. Wash all fruits and vegetables well before peeling or eating. Taking these actions can help to prevent food-borne illnesses that can be very dangerous to your baby, such as listeriosis. Ask your health care provider for more information about listeriosis. What does 150 extra calories look like? Healthy options that provide 150 extra calories each day could be any of the following:  6-8 oz (170-230 g) of plain low-fat yogurt with  cup of berries.  1 apple with 2 teaspoons (11 g) of peanut butter.  Cut-up vegetables with  cup (60 g) of hummus.  8 oz (230 mL) or 1 cup of low-fat chocolate milk.  1 stick of string cheese with 1 medium orange.  1 peanut butter and jelly sandwich that is made with one slice of whole-wheat bread and 1 tsp (5 g) of peanut butter. For 300 extra calories, you could eat two of those healthy options each day. What is a healthy amount of weight to  gain? The right amount of weight gain for you is based on your BMI before you became pregnant. If your BMI:  Was less than 18 (underweight), you should gain 28-40 lb (13-18 kg).  Was 18-24.9 (normal), you should gain 25-35 lb (11-16 kg).  Was 25-29.9 (overweight), you should gain 15-25 lb (7-11 kg).  Was 30 or greater (obese), you should gain 11-20 lb (5-9 kg). What if I am having twins or multiples? Generally, if you are carrying twins or multiples:  You may need to eat 300-600 extra calories a day.  The recommended range for total weight gain is 25-54 lb (11-25 kg), depending on your BMI before pregnancy.  Talk with your health care provider to find out about nutritional needs, weight gain, and exercise that is right for you. What foods can I eat?  Grains All grains. Choose whole grains, such as whole-wheat bread, oatmeal, or brown rice. Vegetables All vegetables. Eat a variety of colors and types of vegetables. Remember to wash your vegetables well before peeling or eating. Fruits All fruits. Eat a variety of colors and types of fruit. Remember to  wash your fruits well before peeling or eating. Meats and other protein foods Lean meats, including chicken, [place], fish, and lean cuts of beef, veal, or pork. If you eat fish or seafood, choose options that are higher in omega-3 fatty acids and lower in mercury, such as salmon, herring, mussels, trout, sardines, pollock, shrimp, crab, and lobster. Tofu. Tempeh. Beans. Eggs. Peanut butter and other nut butters. Make sure that all meats, poultry, and eggs are cooked to food-safe temperatures or "well-done." Two or more servings of fish are recommended each week in order to get the most benefits from omega-3 fatty acids that are found in seafood. Choose fish that are lower in mercury. You can find more information online:  GuamGaming.ch Dairy Pasteurized milk and milk alternatives (such as almond milk). Pasteurized yogurt and pasteurized  cheese. Cottage cheese. Sour cream. Beverages Water. Juices that contain 100% fruit juice or vegetable juice. Caffeine-free teas and decaffeinated coffee. Drinks that contain caffeine are okay to drink, but it is better to avoid caffeine. Keep your total caffeine intake to less than 200 mg each day (which is 12 oz or 355 mL of coffee, tea, or soda) or the limit as told by your health care provider. Fats and oils Fats and oils are okay to include in moderation. Sweets and desserts Sweets and desserts are okay to include in moderation. Seasoning and other foods All pasteurized condiments. The items listed above may not be a complete list of recommended foods and beverages. Contact your dietitian for more options. What foods are not recommended? Vegetables Raw (unpasteurized) vegetable juices. Fruits Unpasteurized fruit juices. Meats and other protein foods Lunch meats, bologna, hot dogs, or other deli meats. (If you must eat those meats, reheat them until they are steaming hot.) Refrigerated pat, meat spreads from a meat counter, smoked seafood that is found in the refrigerated section of a store. Raw or undercooked meats, poultry, and eggs. Raw fish, such as sushi or sashimi. Fish that have high mercury content, such as tilefish, shark, swordfish, and king mackerel. To learn more about mercury in fish, talk with your health care provider or look for online resources, such as:  [place]amGaming.ch Dairy Raw (unpasteurized) milk and any foods that have raw milk in them. Soft cheeses, such as feta, queso blanco, queso fresco, Brie, Camembert cheeses, blue-veined cheeses, and Panela cheese (unless it is made with pasteurized milk, which must be stated on the label). Beverages Alcohol. Sugar-sweetened beverages, such as sodas, teas, or energy drinks. Seasoning and other foods Homemade fermented foods and drinks, such as pickles, sauerkraut, or kombucha drinks. (Store-bought pasteurized versions of these  are okay.) Salads that are made in a store or deli, such as ham salad, chicken salad, egg salad, tuna salad, and seafood salad. The items listed above may not be a complete list of foods and beverages to avoid. Contact your dietitian for more information. Where to find more information To calculate the number of calories you need based on your height, weight, and activity level, you can use an online calculator such as:  MobileTransition.ch To calculate how much weight you should gain during pregnancy, you can use an online pregnancy weight gain calculator such as:  StreamingFood.com.cy Summary  While you are pregnant, your body requires additional nutrition to help support your growing baby.  Eat a variety of foods, especially fruits and vegetables to get a full range of vitamins and minerals.  Practice good food safety and cleanliness. Wash your hands before you eat and after  you prepare raw meat. Wash all fruits and vegetables well before peeling or eating. Taking these actions can help to prevent food-borne illnesses, such as listeriosis, that can be very dangerous to your baby.  Do not eat raw meat or fish. Do not eat fish that have high mercury content, such as tilefish, shark, swordfish, and king mackerel. Do not eat unpasteurized (raw) dairy.  Take a prenatal vitamin to help meet your additional vitamin and mineral needs during pregnancy, specifically for folic acid, iron, calcium, and vitamin D. This information is not intended to replace advice given to you by your health care provider. Make sure you discuss any questions you have with your health care provider. Document Released: 11/09/2013 Document Revised: 10/22/2016 Document Reviewed: 10/22/2016 Elsevier Interactive Patient Education  2019 [employer] Inc.   WHAT OB PATIENTS CAN EXPECT   Confirmation of pregnancy and ultrasound ordered if medically indicated-[redacted] weeks  gestation  New OB (NOB) intake with nurse and New OB (NOB) labs- [redacted] weeks gestation  New OB (NOB) physical examination with provider- 11/[redacted] weeks gestation  Flu vaccine-[redacted] weeks gestation  Anatomy scan-[redacted] weeks gestation  Glucose tolerance test, blood work to test for anemia, T-dap vaccine-[redacted] weeks gestation  Vaginal swabs/cultures-STD/Group B strep-[redacted] weeks gestation  Appointments every 4 weeks until 28 weeks  Every 2 weeks from 28 weeks until 36 weeks  Weekly visits from 36 weeks until delivery

## 2018-02-13 NOTE — Progress Notes (Signed)
Patient c/o constant lower back and pelvic pain that started 3 weeks ago.  Tasha Macias presents for NOB nurse interview visit.  Pregnancy confirmation done at [facility].  G-5.  P-3.  LMP 11/16/2017.  EDD 08/23/2018. Dating scan done at [facility] 12/26/2017.  Pregnancy education material explained and given.  1 Cat in the home, son changes litter box.  NOB labs ordered.  TSH/HgA1C not ordered.Body mass index is 28.22 kg/m.  Sickle cell lab ordered. HIV and drug screen were explained and ordered.  PNV encouraged.  Genetic screening options discussed.  Genetic testing: Unsure.  FMLA form signed.

## 2018-02-14 LAB — URINALYSIS, ROUTINE W REFLEX MICROSCOPIC
BILIRUBIN UA: NEGATIVE
Glucose, UA: NEGATIVE
Leukocytes, UA: NEGATIVE
Nitrite, UA: NEGATIVE
Protein, UA: NEGATIVE
RBC UA: NEGATIVE
Urobilinogen, Ur: 1 mg/dL (ref 0.2–1.0)
pH, UA: 6.5 (ref 5.0–7.5)

## 2018-02-15 LAB — MONITOR DRUG PROFILE 14(MW)
Amphetamine Scrn, Ur: NEGATIVE ng/mL
BARBITURATE SCREEN URINE: NEGATIVE ng/mL
BENZODIAZEPINE SCREEN, URINE: NEGATIVE ng/mL
BUPRENORPHINE, URINE: NEGATIVE ng/mL
CANNABINOIDS UR QL SCN: NEGATIVE ng/mL
CREATININE(CRT), U: 250.6 mg/dL (ref 20.0–300.0)
Cocaine (Metab) Scrn, Ur: NEGATIVE ng/mL
FENTANYL, URINE: NEGATIVE pg/mL
METHADONE SCREEN, URINE: NEGATIVE ng/mL
Meperidine Screen, Urine: NEGATIVE ng/mL
OPIATE SCREEN URINE: NEGATIVE ng/mL
OXYCODONE+OXYMORPHONE UR QL SCN: NEGATIVE ng/mL
PH UR, DRUG SCRN: 6.4 (ref 4.5–8.9)
PHENCYCLIDINE QUANTITATIVE URINE: NEGATIVE ng/mL
Propoxyphene Scrn, Ur: NEGATIVE ng/mL
SPECIFIC GRAVITY: 1.033
Tramadol Screen, Urine: NEGATIVE ng/mL

## 2018-02-15 LAB — RUBELLA SCREEN: RUBELLA: 4.43 {index} (ref >0.99)

## 2018-02-15 LAB — TOXOPLASMA ANTIBODIES- IGG AND  IGM: Toxoplasma Antibody- IgM: <3 AU/mL (ref 0.0–7.9)

## 2018-02-15 LAB — HIV ANTIBODY (ROUTINE TESTING W REFLEX): HIV Screen 4th Generation wRfx: NONREACTIVE

## 2018-02-15 LAB — NICOTINE SCREEN, URINE: Cotinine Ql Scrn, Ur: NEGATIVE ng/mL

## 2018-02-15 LAB — HEPATITIS B SURFACE ANTIGEN: HEP B S AG: NEGATIVE

## 2018-02-15 LAB — VARICELLA ZOSTER ANTIBODY, IGG: Varicella zoster IgG: 869 index (ref >165)

## 2018-02-15 LAB — SICKLE CELL SCREEN: Sickle Cell Screen: NEGATIVE

## 2018-02-15 LAB — ABO AND RH: RH TYPE: POSITIVE

## 2018-02-15 LAB — RPR: RPR: NONREACTIVE

## 2018-02-15 LAB — GC/CHLAMYDIA PROBE AMP
CHLAMYDIA, DNA PROBE: NEGATIVE
Neisseria gonorrhoeae by PCR: NEGATIVE

## 2018-02-15 LAB — ANTIBODY SCREEN: Antibody Screen: NEGATIVE

## 2018-02-17 LAB — URINE CULTURE, OB REFLEX

## 2018-02-17 LAB — CULTURE, OB URINE

## 2018-02-17 MED ORDER — ASPIRIN EC 81 MG PO TBEC: 81.0000 mg | DELAYED_RELEASE_TABLET | Freq: Every day | ORAL | 2 refills | Status: DC

## 2018-02-17 NOTE — Progress Notes (Signed)
NEW OB HISTORY AND PHYSICAL  SUBJECTIVE:       Tasha Macias is a 33 y.o. [phone] female, Patient's last menstrual period was 11/16/2017 (exact date)., Estimated Date of Delivery: 08/23/18, [redacted]w[redacted]d, presents today for establishment of Prenatal Care.  Endorses fatigue and nausea without vomiting. History significant for pre-eclampsia with first pregnancy.   Desires genetic screening. Denies difficulty breathing or respiratory distress, chest pain, abdominal pain, vaginal bleeding, dysuria, and leg pain or swelling.    Gynecologic History  Patient's last menstrual period was 11/16/2017 (exact date).  Period Cycle (Days): 28 Period Duration (Days): 6 Period Pattern: Regular Menstrual Flow: Moderate Menstrual Control: Thin pad Dysmenorrhea: None  Contraception: none   Last Pap: 2019. Results were: normal  Obstetric History  OB History  Gravida Para Term Preterm AB Living  5 3 3  0 1 3  SAB TAB Ectopic Multiple Live Births  1 0 0 0 3    # Outcome Date GA Lbr Len/2nd Weight Sex Delivery Anes PTL Lv  5 Current           4 Term 05/26/11    F Vag-Spont   LIV  3 Term 03/08/10    M Vag-Spont   LIV  2 Term 08/17/07    M Vag-Spont   LIV  1 SAB 2008            Past Medical History:  Diagnosis Date  . Preeclampsia 2009    Past Surgical History:  Procedure Laterality Date  . DILATION AND CURETTAGE OF UTERUS      No Known Allergies  Social History   Socioeconomic History  . Marital status: Single    Spouse name: Not on file  . Number of children: Not on file  . Years of education: Not on file  . Highest education level: Not on file  Occupational History  . Not on file  Social Needs  . Financial resource strain: Not on file  . Food insecurity:    Worry: Not on file    Inability: Not on file  . Transportation needs:    Medical: Not on file    Non-medical: Not on file  Tobacco Use  . Smoking status: Former Smoker    Packs/day: 0.50    Types: Cigarettes  . Smokeless  tobacco: Never Used  Substance and Sexual Activity  . Alcohol use: Not Currently  . Drug use: Not Currently    Types: Marijuana  . Sexual activity: Yes  Lifestyle  . Physical activity:    Days per week: Not on file    Minutes per session: Not on file  . Stress: Not on file  Relationships  . Social connections:    Talks on phone: Not on file    Gets together: Not on file    Attends religious service: Not on file    Active member of club or organization: Not on file    Attends meetings of clubs or organizations: Not on file    Relationship status: Not on file  . Intimate partner violence:    Fear of current or ex partner: Not on file    Emotionally abused: Not on file    Physically abused: Not on file    Forced sexual activity: Not on file  Other Topics Concern  . Not on file  Social History Narrative  . Not on file    Family History  Problem Relation Age of Onset  . Breast cancer Neg Hx   . Ovarian cancer  Neg Hx   . Colon cancer Neg Hx     The following portions of the patient's history were reviewed and updated as appropriate: allergies, current medications, past OB history, past medical history, past surgical history, past family history, past social history, and problem list.    OBJECTIVE:  BP 110/69   Pulse 95   Wt 164 lb 6.4 oz (74.6 kg)   LMP 11/16/2017 (Exact Date) Comment: neg preg  BMI 28.22 kg/m   Initial Physical Exam (New OB)  GENERAL APPEARANCE: alert, well appearing, in no apparent distress  HEAD: normocephalic, atraumatic  MOUTH: mucous membranes moist, pharynx normal without lesions  THYROID: no thyromegaly or masses present  BREASTS: deferred, no complaints  LUNGS: clear to auscultation, no wheezes, rales or rhonchi, symmetric air entry  HEART: regular rate and rhythm, no murmurs  ABDOMEN: soft, nontender, nondistended, no abnormal masses, no epigastric pain and FHT present  EXTREMITIES: no redness or tenderness in the calves or  thighs, no edema  SKIN: normal coloration and turgor, no rashes  LYMPH NODES: no adenopathy palpable  NEUROLOGIC: alert, oriented, normal speech, no focal findings or movement disorder noted  PELVIC EXAM: deferred, no complaints  ASSESSMENT: Normal pregnancy Rh positive History of pre-eclampsia Desires genetic screening-Panorama ordered  PLAN: Prenatal care Rx: Aspirin New OB counseling: The patient has been given an overview regarding routine prenatal care. Recommendations regarding diet, weight gain, and exercise in pregnancy were given. Prenatal testing, optional genetic testing, and ultrasound use in pregnancy were reviewed.  Benefits of Breast Feeding were discussed. The patient is encouraged to consider nursing her baby post partum. See orders   Gunnar Bulla, CNM [facility], [facility]

## 2018-03-13 ENCOUNTER — Ambulatory Visit (INDEPENDENT_AMBULATORY_CARE_PROVIDER_SITE_OTHER): Payer: Medicaid Other | Admitting: Certified Nurse Midwife

## 2018-03-13 ENCOUNTER — Encounter: Payer: Self-pay | Admitting: Certified Nurse Midwife

## 2018-03-13 VITALS — BP 101/62 | HR 86 | Wt 177.1 lb

## 2018-03-13 DIAGNOSIS — Z3491 Encounter for supervision of normal pregnancy, unspecified, first trimester: Secondary | ICD-10-CM

## 2018-03-13 LAB — POCT URINALYSIS DIPSTICK OB
Bilirubin, UA: NEGATIVE
Blood, UA: NEGATIVE
Glucose, UA: NEGATIVE
Ketones, UA: NEGATIVE
LEUKOCYTES UA: NEGATIVE
Nitrite, UA: NEGATIVE
PROTEIN: NEGATIVE
SPEC GRAV UA: 1.025 (ref 1.010–1.025)
UROBILINOGEN UA: 0.2 U/dL
pH, UA: 5 (ref 5.0–8.0)

## 2018-03-13 NOTE — Patient Instructions (Signed)

## 2018-03-13 NOTE — Progress Notes (Signed)
ROB doing well. No questions or concerns. Discussed use of Asprin. She was ordered at her last visit . She state she has not been taking it. Reviewed benefits and recommendations. PT encouraged to start taking it daily to reduce risk of pre eclampsia. Discussed anatomy u/s at next visit. She verbalizes and agree to plan. Follow up 4 wks.   Doreene Burke, CNM

## 2018-03-26 ENCOUNTER — Emergency Department
Admission: EM | Admit: 2018-03-26 | Discharge: 2018-03-26 | Disposition: A | Discharge status: Home / Self Care | Payer: Medicaid Other | Attending: Emergency Medicine | Admitting: Emergency Medicine

## 2018-03-26 ENCOUNTER — Emergency Department: Payer: Medicaid Other

## 2018-03-26 DIAGNOSIS — O9989 Other specified diseases and conditions complicating pregnancy, childbirth and the puerperium: Secondary | ICD-10-CM | POA: Insufficient documentation

## 2018-03-26 DIAGNOSIS — Y9289 Other specified places as the place of occurrence of the external cause: Secondary | ICD-10-CM | POA: Diagnosis not present

## 2018-03-26 DIAGNOSIS — Z3A18 18 weeks gestation of pregnancy: Secondary | ICD-10-CM | POA: Insufficient documentation

## 2018-03-26 DIAGNOSIS — Y999 Unspecified external cause status: Secondary | ICD-10-CM | POA: Insufficient documentation

## 2018-03-26 DIAGNOSIS — R109 Unspecified abdominal pain: Secondary | ICD-10-CM | POA: Insufficient documentation

## 2018-03-26 DIAGNOSIS — Z79899 Other long term (current) drug therapy: Secondary | ICD-10-CM | POA: Diagnosis not present

## 2018-03-26 DIAGNOSIS — S9030XA Contusion of unspecified foot, initial encounter: Secondary | ICD-10-CM

## 2018-03-26 DIAGNOSIS — Z87891 Personal history of nicotine dependence: Secondary | ICD-10-CM | POA: Diagnosis not present

## 2018-03-26 DIAGNOSIS — M79671 Pain in right foot: Secondary | ICD-10-CM | POA: Insufficient documentation

## 2018-03-26 DIAGNOSIS — Y9389 Activity, other specified: Secondary | ICD-10-CM | POA: Insufficient documentation

## 2018-03-26 LAB — KLEIHAUER-BETKE STAIN
Fetal Cells %: 0.3 %
Quantitation Fetal Hemoglobin: 0.0015 mL

## 2018-03-26 MED ORDER — ACETAMINOPHEN 325 MG PO TABS
650.0000 mg | ORAL_TABLET | Freq: Once | ORAL | Status: AC
  Administered 2018-03-26: 650 mg via ORAL
  Filled 2018-03-26: qty 2

## 2018-03-26 NOTE — ED Notes (Signed)
Officer in room to file police report per pt request.
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
## 2018-03-26 NOTE — Discharge Instructions (Addendum)
If severe abdominal pain, gush of fluid or bleeding from your vagina, or any other new or worrisome symptoms return to the emergency room.  Otherwise, try to avoid areas of possible conflict and follow closely with OB/GYN.

## 2018-04-11 ENCOUNTER — Other Ambulatory Visit: Payer: Self-pay | Admitting: Certified Nurse Midwife

## 2018-04-11 DIAGNOSIS — Z3689 Encounter for other specified antenatal screening: Secondary | ICD-10-CM

## 2018-04-11 DIAGNOSIS — Z3492 Encounter for supervision of normal pregnancy, unspecified, second trimester: Secondary | ICD-10-CM

## 2018-04-12 ENCOUNTER — Other Ambulatory Visit: Payer: Medicaid Other

## 2018-04-12 ENCOUNTER — Ambulatory Visit
Admission: RE | Admit: 2018-04-12 | Discharge: 2018-04-12 | Disposition: A | Discharge status: Home / Self Care | Payer: Medicaid Other | Source: Ambulatory Visit | Attending: Certified Nurse Midwife | Admitting: Certified Nurse Midwife

## 2018-04-12 ENCOUNTER — Other Ambulatory Visit: Payer: Self-pay

## 2018-04-12 ENCOUNTER — Encounter: Payer: Medicaid Other | Admitting: Obstetrics and Gynecology

## 2018-04-12 DIAGNOSIS — Z3689 Encounter for other specified antenatal screening: Secondary | ICD-10-CM | POA: Diagnosis present

## 2018-04-12 DIAGNOSIS — Z3492 Encounter for supervision of normal pregnancy, unspecified, second trimester: Secondary | ICD-10-CM | POA: Diagnosis not present

## 2018-04-13 ENCOUNTER — Ambulatory Visit (INDEPENDENT_AMBULATORY_CARE_PROVIDER_SITE_OTHER): Payer: Medicaid Other | Admitting: Obstetrics and Gynecology

## 2018-04-13 VITALS — BP 118/21 | HR 77 | Wt 185.2 lb

## 2018-04-13 DIAGNOSIS — Z3A21 21 weeks gestation of pregnancy: Secondary | ICD-10-CM

## 2018-04-13 DIAGNOSIS — Z3492 Encounter for supervision of normal pregnancy, unspecified, second trimester: Secondary | ICD-10-CM

## 2018-04-13 LAB — POCT URINALYSIS DIPSTICK OB
Bilirubin, UA: NEGATIVE
Blood, UA: NEGATIVE
Glucose, UA: NEGATIVE
Nitrite, UA: NEGATIVE
POC,PROTEIN,UA: NEGATIVE
Spec Grav, UA: 1.02 (ref 1.010–1.025)
UROBILINOGEN UA: NEGATIVE U/dL — AB
pH, UA: 6.5 (ref 5.0–8.0)

## 2018-04-13 NOTE — Progress Notes (Signed)
Pt. States that she is doing well today and does not have any pain at this time.

## 2018-04-13 NOTE — Progress Notes (Signed)
ROB- doing well, reviewed anatomy scan findings below, and will schedule follow up scan in 8 weeks.also discussed weight gain, patient states she is drinking a lot of sodas. Cautions and encouraged replacing with water.  CLINICAL DATA:  Current assigned gestational age of [redacted] weeks 0 days. Evaluate fetal growth and anatomy.  EXAM: OBSTETRICAL ULTRASOUND >14 WKS  FINDINGS: Number of Fetuses: 1  Heart Rate:  143 bpm  Movement: Yes  Presentation: Cephalic  Previa: No  Placental Location: Posterior  Amniotic Fluid (Subjective): Within normal limits  Amniotic Fluid (Objective):  Vertical pocket = 5.3cm  FETAL BIOMETRY  BPD: 4.9cm 20w 6d  HC:   18.9cm 21w 1d  AC:   16.0cm 21w 1d  FL:   3.5cm 21w 1d  Current Mean GA: 21w 0d [place] EDC: 08/23/2018  Assigned GA:  21w 0d Assigned EDC: 08/23/2018  FETAL ANATOMY  Lateral Ventricles: Appears normal  Thalami/CSP: Appears normal  Posterior Fossa:  Appears normal  Nuchal Region: Appears normal   NFT= N/A > 20 WKS  Upper Lip: Appears normal  Spine: Appears normal  4 Chamber Heart on Left: Appears normal  LVOT: Appears normal  RVOT: Appears normal  Stomach on Left: Appears normal  3 Vessel Cord: Appears normal  Cord Insertion site: Appears normal  Kidneys: Present; mild right fetal renal pyelectasis seen measuring 4 mm  Bladder: Appears normal  Extremities: Appears normal  Sex: Female  Maternal Findings:  Cervix:  3.7 cm TA  IMPRESSION: Current assigned gestational age of [redacted] weeks 0 days. Appropriate fetal growth.  No fetal anomalies identified.  Mild right fetal renal pyelectasis measuring 4 mm. This is considered a soft sonographic marker for Trisomy 21. Recommend correlation with antenatal screening test results if performed, and consider genetic counseling if desired. Followup by ultrasound is also recommended in 8 weeks.   Electronically Signed   By:  Myles Rosenthal M.D.   On: 04/12/2018 11:58

## 2018-05-03 ENCOUNTER — Telehealth: Payer: Self-pay | Admitting: Certified Nurse Midwife

## 2018-05-03 NOTE — Telephone Encounter (Signed)
I called the patient to reschedule her next apt due to covid-19. The patient was upset when I informed her that she does not have a ultrasound scheduled. The patient would like a call back. I informed the patient that we only had an ob apt on her apt desk. Please advise.

## 2018-05-11 ENCOUNTER — Telehealth: Payer: Self-pay

## 2018-05-11 ENCOUNTER — Encounter: Payer: Medicaid Other | Admitting: Certified Nurse Midwife

## 2018-05-11 NOTE — Telephone Encounter (Signed)
Coronavirus (COVID-19) Are you at risk?  Are you at risk for the Coronavirus (COVID-19)?  To be considered HIGH RISK for Coronavirus (COVID-19), you have to meet the following criteria:  . Traveled to [place], [place], [place], [place] or [place]; or in the [place] to [place], [place], [place], or [place]; and have fever, cough, and shortness of breath within the last 2 weeks of travel OR . Been in close contact with a person diagnosed with COVID-19 within the last 2 weeks and have fever, cough, and shortness of breath . IF YOU DO NOT MEET THESE CRITERIA, YOU ARE CONSIDERED LOW RISK FOR COVID-19.  What to do if you are HIGH RISK for COVID-19?  . If you are having a medical emergency, call 911. . Seek medical care right away. Before you go to a doctor's office, urgent care or emergency department, call ahead and tell them about your recent travel, contact with someone diagnosed with COVID-19, and your symptoms. You should receive instructions from your physician's office regarding next steps of care.  . When you arrive at healthcare provider, tell the healthcare staff immediately you have returned from visiting [place], [place], [place], [place] or [place]; or traveled in the [place] to [place], [place], [place], or [place]; in the last two weeks or you have been in close contact with a person diagnosed with COVID-19 in the last 2 weeks.   . Tell the health care staff about your symptoms: fever, cough and shortness of breath. . After you have been seen by a medical provider, you will be either: o Tested for (COVID-19) and discharged home on quarantine except to seek medical care if symptoms worsen, and asked to  - Stay home and avoid contact with others until you get your results (4-5 days)  - Avoid travel on public transportation if possible (such as bus, train, or airplane) or o Sent to the Emergency Department by EMS for evaluation, COVID-19 testing, and possible  admission depending on your condition and test results.  What to do if you are LOW RISK for COVID-19?  Reduce your risk of any infection by using the same precautions used for avoiding the common cold or flu:  . Wash your hands often with soap and warm water for at least 20 seconds.  If soap and water are not readily available, use an alcohol-based hand sanitizer with at least 60% alcohol.  . If coughing or sneezing, cover your mouth and nose by coughing or sneezing into the elbow areas of your shirt or coat, into a tissue or into your sleeve (not your hands). . Avoid shaking hands with others and consider head nods or verbal greetings only. . Avoid touching your eyes, nose, or mouth with unwashed hands.  . Avoid close contact with people who are sick. . Avoid places or events with large numbers of people in one location, like concerts or sporting events. . Carefully consider travel plans you have or are making. . If you are planning any travel outside or inside the [place], visit the CDC's Travelers' Health webpage for the latest health notices. . If you have some symptoms but not all symptoms, continue to monitor at home and seek medical attention if your symptoms worsen. . If you are having a medical emergency, call 911.   ADDITIONAL HEALTHCARE OPTIONS FOR PATIENTS  [facility] / e-Visit: [email or web address]         [facility]: [phone]  [facility]: [phone]                   [facility]: [phone]   Prescreened- neg. cm  

## 2018-05-12 ENCOUNTER — Other Ambulatory Visit: Payer: Medicaid Other

## 2018-05-12 ENCOUNTER — Other Ambulatory Visit: Payer: Self-pay

## 2018-05-12 ENCOUNTER — Ambulatory Visit (INDEPENDENT_AMBULATORY_CARE_PROVIDER_SITE_OTHER): Payer: Medicaid Other | Admitting: Certified Nurse Midwife

## 2018-05-12 ENCOUNTER — Encounter: Payer: Self-pay | Admitting: Certified Nurse Midwife

## 2018-05-12 VITALS — BP 100/58 | HR 80 | Wt 190.4 lb

## 2018-05-12 DIAGNOSIS — Z3492 Encounter for supervision of normal pregnancy, unspecified, second trimester: Secondary | ICD-10-CM

## 2018-05-12 DIAGNOSIS — O358XX0 Maternal care for other (suspected) fetal abnormality and damage, not applicable or unspecified: Secondary | ICD-10-CM | POA: Insufficient documentation

## 2018-05-12 DIAGNOSIS — O35EXX0 Maternal care for other (suspected) fetal abnormality and damage, fetal genitourinary anomalies, not applicable or unspecified: Secondary | ICD-10-CM | POA: Insufficient documentation

## 2018-05-12 LAB — POCT URINALYSIS DIPSTICK OB
Bilirubin, UA: NEGATIVE
Blood, UA: NEGATIVE
Glucose, UA: NEGATIVE
Ketones, UA: NEGATIVE
Leukocytes, UA: NEGATIVE
Nitrite, UA: NEGATIVE
POC,PROTEIN,UA: NEGATIVE
Spec Grav, UA: 1.02 (ref 1.010–1.025)
Urobilinogen, UA: 0.2 E.U./dL
pH, UA: 7 (ref 5.0–8.0)

## 2018-05-12 NOTE — Progress Notes (Signed)
ROB-Doing well. CBC, glucola, and RPR today. COVID restrictions and precautions discussed. TDaP and BTC next visit. Education regarding weight gain in pregnancy. Anticipatory guidance regarding course of prenatal care. RTC x 4 weeks for follow up ultrasound and ROB due to fetal pyelectasis.

## 2018-05-12 NOTE — Patient Instructions (Signed)
Eating Plan for Pregnant Women °While you are pregnant, your body requires additional nutrition to help support your growing baby. You also have a higher need for some vitamins and minerals, such as folic acid, calcium, iron, and vitamin D. Eating a healthy, well-balanced diet is very important for your health and your baby's health. Your need for extra calories varies for the three 3-month segments of your pregnancy (trimesters). For most women, it is recommended to consume: °· 150 extra calories a day during the first trimester. °· 300 extra calories a day during the second trimester. °· 300 extra calories a day during the third trimester. °What are tips for following this plan? ° °· Do not try to lose weight or go on a diet during pregnancy. °· Limit your overall intake of foods that have "empty calories." These are foods that have little nutritional value, such as sweets, desserts, candies, and sugar-sweetened beverages. °· Eat a variety of foods (especially fruits and vegetables) to get a full range of vitamins and minerals. °· Take a prenatal vitamin to help meet your additional vitamin and mineral needs during pregnancy, specifically for folic acid, iron, calcium, and vitamin D. °· Remember to stay active. Ask your health care provider what types of exercise and activities are safe for you. °· Practice good food safety and cleanliness. Wash your hands before you eat and after you prepare raw meat. Wash all fruits and vegetables well before peeling or eating. Taking these actions can help to prevent food-borne illnesses that can be very dangerous to your baby, such as listeriosis. Ask your health care provider for more information about listeriosis. °What does 150 extra calories look like? °Healthy options that provide 150 extra calories each day could be any of the following: °· 6-8 oz (170-230 g) of plain low-fat yogurt with ½ cup of berries. °· 1 apple with 2 teaspoons (11 g) of peanut butter. °· Cut-up  vegetables with ¼ cup (60 g) of hummus. °· 8 oz (230 mL) or 1 cup of low-fat chocolate milk. °· 1 stick of string cheese with 1 medium orange. °· 1 peanut butter and jelly sandwich that is made with one slice of whole-wheat bread and 1 tsp (5 g) of peanut butter. °For 300 extra calories, you could eat two of those healthy options each day. °What is a healthy amount of weight to gain? °The right amount of weight gain for you is based on your BMI before you became pregnant. If your BMI: °· Was less than 18 (underweight), you should gain 28-40 lb (13-18 kg). °· Was 18-24.9 (normal), you should gain 25-35 lb (11-16 kg). °· Was 25-29.9 (overweight), you should gain 15-25 lb (7-11 kg). °· Was 30 or greater (obese), you should gain 11-20 lb (5-9 kg). °What if I am having twins or multiples? °Generally, if you are carrying twins or multiples: °· You may need to eat 300-600 extra calories a day. °· The recommended range for total weight gain is 25-54 lb (11-25 kg), depending on your BMI before pregnancy. °· Talk with your health care provider to find out about nutritional needs, weight gain, and exercise that is right for you. °What foods can I eat? ° °Grains °All grains. Choose whole grains, such as whole-wheat bread, oatmeal, or brown rice. °Vegetables °All vegetables. Eat a variety of colors and types of vegetables. Remember to wash your vegetables well before peeling or eating. °Fruits °All fruits. Eat a variety of colors and types of fruit. Remember to wash   your fruits well before peeling or eating. Meats and other protein foods Lean meats, including chicken, [place], fish, and lean cuts of beef, veal, or pork. If you eat fish or seafood, choose options that are higher in omega-3 fatty acids and lower in mercury, such as salmon, herring, mussels, trout, sardines, pollock, shrimp, crab, and lobster. Tofu. Tempeh. Beans. Eggs. Peanut butter and other nut butters. Make sure that all meats, poultry, and eggs are cooked to  food-safe temperatures or "well-done." Two or more servings of fish are recommended each week in order to get the most benefits from omega-3 fatty acids that are found in seafood. Choose fish that are lower in mercury. You can find more information online:  [employer]ch Dairy Pasteurized milk and milk alternatives (such as almond milk). Pasteurized yogurt and pasteurized cheese. Cottage cheese. Sour cream. Beverages Water. Juices that contain 100% fruit juice or vegetable juice. Caffeine-free teas and decaffeinated coffee. Drinks that contain caffeine are okay to drink, but it is better to avoid caffeine. Keep your total caffeine intake to less than 200 mg each day (which is 12 oz or 355 mL of coffee, tea, or soda) or the limit as told by your health care provider. Fats and oils Fats and oils are okay to include in moderation. Sweets and desserts Sweets and desserts are okay to include in moderation. Seasoning and other foods All pasteurized condiments. The items listed above may not be a complete list of recommended foods and beverages. Contact your dietitian for more options. What foods are not recommended? Vegetables Raw (unpasteurized) vegetable juices. Fruits Unpasteurized fruit juices. Meats and other protein foods Lunch meats, bologna, hot dogs, or other deli meats. (If you must eat those meats, reheat them until they are steaming hot.) Refrigerated pat, meat spreads from a meat counter, smoked seafood that is found in the refrigerated section of a store. Raw or undercooked meats, poultry, and eggs. Raw fish, such as sushi or sashimi. Fish that have high mercury content, such as tilefish, shark, swordfish, and king mackerel. To learn more about mercury in fish, talk with your health care provider or look for online resources, such as:  [employer]ch Dairy Raw (unpasteurized) milk and any foods that have raw milk in them. Soft cheeses, such as feta, queso blanco, queso fresco, Brie,  Camembert cheeses, blue-veined cheeses, and Panela cheese (unless it is made with pasteurized milk, which must be stated on the label). Beverages Alcohol. Sugar-sweetened beverages, such as sodas, teas, or energy drinks. Seasoning and other foods Homemade fermented foods and drinks, such as pickles, sauerkraut, or kombucha drinks. (Store-bought pasteurized versions of these are okay.) Salads that are made in a store or deli, such as ham salad, chicken salad, egg salad, tuna salad, and seafood salad. The items listed above may not be a complete list of foods and beverages to avoid. Contact your dietitian for more information. Where to find more information To calculate the number of calories you need based on your height, weight, and activity level, you can use an online calculator such as:  MobileTransition.ch To calculate how much weight you should gain during pregnancy, you can use an online pregnancy weight gain calculator such as:  StreamingFood.com.cy Summary  While you are pregnant, your body requires additional nutrition to help support your growing baby.  Eat a variety of foods, especially fruits and vegetables to get a full range of vitamins and minerals.  Practice good food safety and cleanliness. Wash your hands before you eat and after you  prepare raw meat. Wash all fruits and vegetables well before peeling or eating. Taking these actions can help to prevent food-borne illnesses, such as listeriosis, that can be very dangerous to your baby.  Do not eat raw meat or fish. Do not eat fish that have high mercury content, such as tilefish, shark, swordfish, and king mackerel. Do not eat unpasteurized (raw) dairy.  Take a prenatal vitamin to help meet your additional vitamin and mineral needs during pregnancy, specifically for folic acid, iron, calcium, and vitamin D. This information is not intended to replace advice given to you by your  health care provider. Make sure you discuss any questions you have with your health care provider. Document Released: 11/09/2013 Document Revised: 10/22/2016 Document Reviewed: 10/22/2016 Elsevier Interactive Patient Education  2019 [employer]. Second Trimester of Pregnancy  The second trimester is from week 14 through week 27 (month 4 through 6). This is often the time in pregnancy that you feel your best. Often times, morning sickness has lessened or quit. You may have more energy, and you may get hungry more often. Your unborn baby is growing rapidly. At the end of the sixth month, he or she is about 9 inches long and weighs about 1 pounds. You will likely feel the baby move between 18 and 20 weeks of pregnancy. Follow these instructions at home: Medicines  Take over-the-counter and prescription medicines only as told by your doctor. Some medicines are safe and some medicines are not safe during pregnancy.  Take a prenatal vitamin that contains at least 600 micrograms (mcg) of folic acid.  If you have trouble pooping (constipation), take medicine that will make your stool soft (stool softener) if your doctor approves. Eating and drinking   Eat regular, healthy meals.  Avoid raw meat and uncooked cheese.  If you get low calcium from the food you eat, talk to your doctor about taking a daily calcium supplement.  Avoid foods that are high in fat and sugars, such as fried and sweet foods.  If you feel sick to your stomach (nauseous) or throw up (vomit): ? Eat 4 or 5 small meals a day instead of 3 large meals. ? Try eating a few soda crackers. ? Drink liquids between meals instead of during meals.  To prevent constipation: ? Eat foods that are high in fiber, like fresh fruits and vegetables, whole grains, and beans. ? Drink enough fluids to keep your pee (urine) clear or pale yellow. Activity  Exercise only as told by your doctor. Stop exercising if you start to have  cramps.  Do not exercise if it is too hot, too humid, or if you are in a place of great height (high altitude).  Avoid heavy lifting.  Wear low-heeled shoes. Sit and stand up straight.  You can continue to have sex unless your doctor tells you not to. Relieving pain and discomfort  Wear a good support bra if your breasts are tender.  Take warm water baths (sitz baths) to soothe pain or discomfort caused by hemorrhoids. Use hemorrhoid cream if your doctor approves.  Rest with your legs raised if you have leg cramps or low back pain.  If you develop puffy, bulging veins (varicose veins) in your legs: ? Wear support hose or compression stockings as told by your doctor. ? Raise (elevate) your feet for 15 minutes, 3-4 times a day. ? Limit salt in your food. Prenatal care  Write down your questions. Take them to your prenatal visits.  Keep  all your prenatal visits as told by your doctor. This is important. Safety  Wear your seat belt when driving.  Make a list of emergency phone numbers, including numbers for family, friends, the hospital, and police and fire departments. General instructions  Ask your doctor about the right foods to eat or for help finding a counselor, if you need these services.  Ask your doctor about local prenatal classes. Begin classes before month 6 of your pregnancy.  Do not use hot tubs, steam rooms, or saunas.  Do not douche or use tampons or scented sanitary pads.  Do not cross your legs for long periods of time.  Visit your dentist if you have not done so. Use a soft toothbrush to brush your teeth. Floss gently.  Avoid all smoking, herbs, and alcohol. Avoid drugs that are not approved by your doctor.  Do not use any products that contain nicotine or tobacco, such as cigarettes and e-cigarettes. If you need help quitting, ask your doctor.  Avoid cat litter boxes and soil used by cats. These carry germs that can cause birth defects in the baby and  can cause a loss of your baby (miscarriage) or stillbirth. Contact a doctor if:  You have mild cramps or pressure in your lower belly.  You have pain when you pee (urinate).  You have bad smelling fluid coming from your vagina.  You continue to feel sick to your stomach (nauseous), throw up (vomit), or have watery poop (diarrhea).  You have a nagging pain in your belly area.  You feel dizzy. Get help right away if:  You have a fever.  You are leaking fluid from your vagina.  You have spotting or bleeding from your vagina.  You have severe belly cramping or pain.  You lose or gain weight rapidly.  You have trouble catching your breath and have chest pain.  You notice sudden or extreme puffiness (swelling) of your face, hands, ankles, feet, or legs.  You have not felt the baby move in over an hour.  You have severe headaches that do not go away when you take medicine.  You have trouble seeing. Summary  The second trimester is from week 14 through week 27 (months 4 through 6). This is often the time in pregnancy that you feel your best.  To take care of yourself and your unborn baby, you will need to eat healthy meals, take medicines only if your doctor tells you to do so, and do activities that are safe for you and your baby.  Call your doctor if you get sick or if you notice anything unusual about your pregnancy. Also, call your doctor if you need help with the right food to eat, or if you want to know what activities are safe for you. This information is not intended to replace advice given to you by your health care provider. Make sure you discuss any questions you have with your health care provider. Document Released: 04/21/2009 Document Revised: 03/02/2016 Document Reviewed: 03/02/2016 Elsevier Interactive Patient Education  2019 [employer].

## 2018-05-13 LAB — CBC
Hematocrit: 33.9 % — ABNORMAL LOW (ref 34.0–46.6)
Hemoglobin: 11.4 g/dL (ref 11.1–15.9)
MCH: 32.3 pg (ref 26.6–33.0)
MCHC: 33.6 g/dL (ref 31.5–35.7)
MCV: 96 fL (ref 79–97)
Platelets: 191 10*3/uL (ref 150–450)
RBC: 3.53 x10E6/uL — ABNORMAL LOW (ref 3.77–5.28)
RDW: 12.4 % (ref 11.7–15.4)
WBC: 6.4 10*3/uL (ref 3.4–10.8)

## 2018-05-13 LAB — RPR: RPR Ser Ql: NONREACTIVE

## 2018-05-13 LAB — GLUCOSE, 1 HOUR GESTATIONAL: Gestational Diabetes Screen: 86 mg/dL (ref 65–139)

## 2018-06-06 ENCOUNTER — Telehealth: Payer: Self-pay | Admitting: *Deleted

## 2018-06-06 NOTE — Telephone Encounter (Signed)
Coronavirus (COVID-19) Are you at risk?  Are you at risk for the Coronavirus (COVID-19)?  To be considered HIGH RISK for Coronavirus (COVID-19), you have to meet the following criteria:  . Traveled to [place], [place], [place], [place] or [place]; or in the [place] to [place], [place], [place], or [place]; and have fever, cough, and shortness of breath within the last 2 weeks of travel OR . Been in close contact with a person diagnosed with COVID-19 within the last 2 weeks and have fever, cough, and shortness of breath . IF YOU DO NOT MEET THESE CRITERIA, YOU ARE CONSIDERED LOW RISK FOR COVID-19.  What to do if you are HIGH RISK for COVID-19?  . If you are having a medical emergency, call 911. . Seek medical care right away. Before you go to a doctor's office, urgent care or emergency department, call ahead and tell them about your recent travel, contact with someone diagnosed with COVID-19, and your symptoms. You should receive instructions from your physician's office regarding next steps of care.  . When you arrive at healthcare provider, tell the healthcare staff immediately you have returned from visiting [place], [place], [place], [place] or [place]; or traveled in the [place] to [place], [place], [place], or [place]; in the last two weeks or you have been in close contact with a person diagnosed with COVID-19 in the last 2 weeks.   . Tell the health care staff about your symptoms: fever, cough and shortness of breath. . After you have been seen by a medical provider, you will be either: o Tested for (COVID-19) and discharged home on quarantine except to seek medical care if symptoms worsen, and asked to  - Stay home and avoid contact with others until you get your results (4-5 days)  - Avoid travel on public transportation if possible (such as bus, train, or airplane) or o Sent to the Emergency Department by EMS for evaluation, COVID-19 testing, and possible  admission depending on your condition and test results.  What to do if you are LOW RISK for COVID-19?  Reduce your risk of any infection by using the same precautions used for avoiding the common cold or flu:  . Wash your hands often with soap and warm water for at least 20 seconds.  If soap and water are not readily available, use an alcohol-based hand sanitizer with at least 60% alcohol.  . If coughing or sneezing, cover your mouth and nose by coughing or sneezing into the elbow areas of your shirt or coat, into a tissue or into your sleeve (not your hands). . Avoid shaking hands with others and consider head nods or verbal greetings only. . Avoid touching your eyes, nose, or mouth with unwashed hands.  . Avoid close contact with people who are sick. . Avoid places or events with large numbers of people in one location, like concerts or sporting events. . Carefully consider travel plans you have or are making. . If you are planning any travel outside or inside the [place], visit the CDC's Travelers' Health webpage for the latest health notices. . If you have some symptoms but not all symptoms, continue to monitor at home and seek medical attention if your symptoms worsen. . If you are having a medical emergency, call 911.   ADDITIONAL HEALTHCARE OPTIONS FOR PATIENTS  [facility] / e-Visit: [email or web address]         [facility]: [phone]  [facility]: [phone]                   [facility]: [phone]   Spoke with pt denies any sx.  Angila Wombles, CMA 

## 2018-06-07 ENCOUNTER — Ambulatory Visit (INDEPENDENT_AMBULATORY_CARE_PROVIDER_SITE_OTHER): Payer: Medicaid Other

## 2018-06-07 ENCOUNTER — Encounter: Payer: Self-pay | Admitting: Certified Nurse Midwife

## 2018-06-07 ENCOUNTER — Other Ambulatory Visit: Payer: Self-pay

## 2018-06-07 ENCOUNTER — Other Ambulatory Visit: Payer: Self-pay | Admitting: Obstetrics and Gynecology

## 2018-06-07 ENCOUNTER — Ambulatory Visit (INDEPENDENT_AMBULATORY_CARE_PROVIDER_SITE_OTHER): Payer: Medicaid Other | Admitting: Certified Nurse Midwife

## 2018-06-07 VITALS — BP 97/63 | HR 83 | Wt 190.1 lb

## 2018-06-07 DIAGNOSIS — N289 Disorder of kidney and ureter, unspecified: Secondary | ICD-10-CM | POA: Diagnosis not present

## 2018-06-07 DIAGNOSIS — Z3493 Encounter for supervision of normal pregnancy, unspecified, third trimester: Secondary | ICD-10-CM

## 2018-06-07 DIAGNOSIS — Z3A29 29 weeks gestation of pregnancy: Secondary | ICD-10-CM | POA: Diagnosis not present

## 2018-06-07 DIAGNOSIS — O26833 Pregnancy related renal disease, third trimester: Secondary | ICD-10-CM | POA: Diagnosis not present

## 2018-06-07 DIAGNOSIS — Z3491 Encounter for supervision of normal pregnancy, unspecified, first trimester: Secondary | ICD-10-CM

## 2018-06-07 MED ORDER — TETANUS-DIPHTH-ACELL PERTUSSIS 5-2.5-18.5 LF-MCG/0.5 IM SUSP
0.5000 mL | Freq: Once | INTRAMUSCULAR | Status: AC
  Administered 2018-06-07: 0.5 mL via INTRAMUSCULAR

## 2018-06-07 NOTE — Patient Instructions (Signed)
.  Preeclampsia and Eclampsia    Preeclampsia is a serious condition that may develop during pregnancy. It is also called toxemia of pregnancy. This condition causes high blood pressure along with other symptoms, such as swelling and headaches. These symptoms may develop as the condition gets worse. Preeclampsia may occur at 20 weeks of pregnancy or later.  Diagnosing and treating preeclampsia early is very important. If not treated early, it can cause serious problems for you and your baby. One problem it can lead to is eclampsia. Eclampsia is a condition that causes muscle jerking or shaking (convulsions or seizures) and other serious problems for the mother. During pregnancy, delivering your baby may be the best treatment for preeclampsia or eclampsia. For most women, preeclampsia and eclampsia symptoms go away after giving birth.  In rare cases, a woman may develop preeclampsia after giving birth (postpartum preeclampsia). This usually occurs within 48 hours after childbirth but may occur up to 6 weeks after giving birth.  What are the causes?  The cause of preeclampsia is not known.  What increases the risk?  The following risk factors make you more likely to develop preeclampsia:   Being pregnant for the first time.   Having had preeclampsia during a past pregnancy.   Having a family history of preeclampsia.   Having high blood pressure.   Being pregnant with more than one baby.   Being 35 or older.   Being African-American.   Having kidney disease or diabetes.   Having medical conditions such as lupus or blood diseases.   Being very overweight (obese).  What are the signs or symptoms?  The earliest signs of preeclampsia are:   High blood pressure.   Increased protein in your urine. Your health care provider will check for this at every visit before you give birth (prenatal visit).  Other symptoms that may develop as the condition gets worse include:   Severe headaches.   Sudden weight  gain.   Swelling of the hands, face, legs, and feet.   Nausea and vomiting.   Vision problems, such as blurred or double vision.   Numbness in the face, arms, legs, and feet.   Urinating less than usual.   Dizziness.   Slurred speech.   Abdominal pain, especially upper abdominal pain.   Convulsions or seizures.  How is this diagnosed?  There are no screening tests for preeclampsia. Your health care provider will ask you about symptoms and check for signs of preeclampsia during your prenatal visits. You may also have tests that include:   Urine tests.   Blood tests.   Checking your blood pressure.   Monitoring your baby's heart rate.   Ultrasound.  How is this treated?  You and your health care provider will determine the treatment approach that is best for you. Treatment may include:   Having more frequent prenatal exams to check for signs of preeclampsia, if you have an increased risk for preeclampsia.   Medicine to lower your blood pressure.   Staying in the hospital, if your condition is severe. There, treatment will focus on controlling your blood pressure and the amount of fluids in your body (fluid retention).   Taking medicine (magnesium sulfate) to prevent seizures. This may be given as an injection or through an IV.   Taking a low-dose aspirin during your pregnancy.   Delivering your baby early, if your condition gets worse. You may have your labor started with medicine (induced), or you may have a cesarean   delivery.  Follow these instructions at home:  Eating and drinking     Drink enough fluid to keep your urine pale yellow.   Avoid caffeine.  Lifestyle   Do not use any products that contain nicotine or tobacco, such as cigarettes and e-cigarettes. If you need help quitting, ask your health care provider.   Do not use alcohol or drugs.   Avoid stress as much as possible. Rest and get plenty of sleep.  General instructions   Take over-the-counter and prescription medicines only as  told by your health care provider.   When lying down, lie on your left side. This keeps pressure off your major blood vessels.   When sitting or lying down, raise (elevate) your feet. Try putting some pillows underneath your lower legs.   Exercise regularly. Ask your health care provider what kinds of exercise are best for you.   Keep all follow-up and prenatal visits as told by your health care provider. This is important.  How is this prevented?  There is no known way of preventing preeclampsia or eclampsia from developing. However, to lower your risk of complications and detect problems early:   Get regular prenatal care. Your health care provider may be able to diagnose and treat the condition early.   Maintain a healthy weight. Ask your health care provider for help managing weight gain during pregnancy.   Work with your health care provider to manage any long-term (chronic) health conditions you have, such as diabetes or kidney problems.   You may have tests of your blood pressure and kidney function after giving birth.   Your health care provider may have you take low-dose aspirin during your next pregnancy.  Contact a health care provider if:   You have symptoms that your health care provider told you may require more treatment or monitoring, such as:  ? Headaches.  ? Nausea or vomiting.  ? Abdominal pain.  ? Dizziness.  ? Light-headedness.  Get help right away if:   You have severe:  ? Abdominal pain.  ? Headaches that do not get better.  ? Dizziness.  ? Vision problems.  ? Confusion.  ? Nausea or vomiting.   You have any of the following:  ? A seizure.  ? Sudden, rapid weight gain.  ? Sudden swelling in your hands, ankles, or face.  ? Trouble moving any part of your body.  ? Numbness in any part of your body.  ? Trouble speaking.  ? Abnormal bleeding.   You faint.  Summary   Preeclampsia is a serious condition that may develop during pregnancy. It is also called toxemia of pregnancy.   This  condition causes high blood pressure along with other symptoms, such as swelling and headaches.   Diagnosing and treating preeclampsia early is very important. If not treated early, it can cause serious problems for you and your baby.   Get help right away if you have symptoms that your health care provider told you to watch for.  This information is not intended to replace advice given to you by your health care provider. Make sure you discuss any questions you have with your health care provider.  Document Released: 01/23/2000 Document Revised: 01/11/2017 Document Reviewed: 09/01/2015  Elsevier Interactive Patient Education  2019 [employer]

## 2018-06-07 NOTE — Progress Notes (Signed)
ROB follow up u/s see below for results. Consult Dr. Valentino Saxon no further u/s needed. BTL consent signed today. Pt states she is not 100% but is leaning towards it. TDAP/BTC signed today. Pt feels good movement denies contractions. Pre eclampsia sign and symptoms reviewed. Follow up 6 wks.   Doreene Burke, CNM    Patient Name: Tasha Macias DOB: 09-Apr-1985 MRN: [id number] ULTRASOUND REPORT  Location: [facility] Date of Service: 06/07/2018   Indications: Anatomy follow up ultrasound renal hydro. Findings:  Singleton intrauterine pregnancy is visualized with FHR at 143 BPM.  Fetal presentation is Breech.  Placenta: anterior, posterior. Grade:0 AFI: subjectively normal.  Anatomic survey is complete.  Rt renal pelvis filling measuring 70mm. Lt renal pelvis is WNL.  There is no free peritoneal fluid in the cul de sac.  Impression: 1. [redacted]w[redacted]d Viable Singleton Intrauterine pregnancy previously established criteria. 2. Rt renal pelvis filling measuring 44mm. No change from prior study. 3. Lt renal pelvis WNL.  Recommendations: 1.Clinical correlation with the patient's History and Physical Exam.   Jenine M. Marciano Sequin    RDMS

## 2018-06-21 ENCOUNTER — Other Ambulatory Visit: Payer: Self-pay

## 2018-06-21 ENCOUNTER — Observation Stay
Admission: EM | Admit: 2018-06-21 | Discharge: 2018-06-22 | Disposition: A | Discharge status: Home / Self Care | Payer: Medicaid Other | Attending: Certified Nurse Midwife | Admitting: Certified Nurse Midwife

## 2018-06-21 ENCOUNTER — Telehealth: Payer: Self-pay | Admitting: Obstetrics and Gynecology

## 2018-06-21 DIAGNOSIS — O26853 Spotting complicating pregnancy, third trimester: Principal | ICD-10-CM | POA: Insufficient documentation

## 2018-06-21 DIAGNOSIS — Z3A31 31 weeks gestation of pregnancy: Secondary | ICD-10-CM | POA: Diagnosis not present

## 2018-06-21 DIAGNOSIS — O358XX0 Maternal care for other (suspected) fetal abnormality and damage, not applicable or unspecified: Secondary | ICD-10-CM

## 2018-06-21 DIAGNOSIS — O35EXX0 Maternal care for other (suspected) fetal abnormality and damage, fetal genitourinary anomalies, not applicable or unspecified: Secondary | ICD-10-CM

## 2018-06-21 MED ORDER — ACETAMINOPHEN 500 MG PO TABS
1000.0000 mg | ORAL_TABLET | Freq: Once | ORAL | Status: AC
  Administered 2018-06-21: 1000 mg via ORAL
  Filled 2018-06-21: qty 2

## 2018-06-21 MED ORDER — ONDANSETRON HCL 4 MG/2ML IJ SOLN: 4.0000 mg | Freq: Four times a day (QID) | INTRAMUSCULAR | Status: DC | PRN

## 2018-06-21 MED ORDER — CYCLOBENZAPRINE HCL 10 MG PO TABS
5.0000 mg | ORAL_TABLET | Freq: Once | ORAL | Status: AC | PRN
  Administered 2018-06-22: 5 mg via ORAL
  Filled 2018-06-21: qty 1
  Filled 2018-06-21: qty 0.5

## 2018-06-21 NOTE — OB Triage Note (Signed)
Pt is a 33 y.o. G5P3 seenat 31 wk for c/o of spotting and abdominal pain. Pt states the abdominal pain started around 1300 today and the spotting started yesterday morning. Pt states the spotting is "light pink tinged color." Pt rates her pain a 6/10 and describes the pain as a "shotting, pulling sensation at her navel." Pt states positive fetal movement. Pt denies LOF. FHT 147. Monitors applied and assessing. Will continue to monitor.

## 2018-06-21 NOTE — Telephone Encounter (Signed)
Go to hospital to be evaluated

## 2018-06-21 NOTE — Telephone Encounter (Signed)
The patient called and stated that she has been spotting since last night and is concerned for baby. The patient is hoping to speak with a nurse as soon as possible. Please advise.

## 2018-06-21 NOTE — Telephone Encounter (Signed)
Pt states she started spotting last night and today, BRB, she is wearing a pad, no sexual intercourse, pls advise

## 2018-06-21 NOTE — Telephone Encounter (Signed)
Called pt advised to go to L/d she voiced understanding

## 2018-06-22 DIAGNOSIS — O26893 Other specified pregnancy related conditions, third trimester: Secondary | ICD-10-CM

## 2018-06-22 DIAGNOSIS — O358XX0 Maternal care for other (suspected) fetal abnormality and damage, not applicable or unspecified: Secondary | ICD-10-CM | POA: Diagnosis not present

## 2018-06-22 DIAGNOSIS — R109 Unspecified abdominal pain: Secondary | ICD-10-CM

## 2018-06-22 DIAGNOSIS — O26853 Spotting complicating pregnancy, third trimester: Secondary | ICD-10-CM | POA: Diagnosis not present

## 2018-06-22 DIAGNOSIS — Z3A31 31 weeks gestation of pregnancy: Secondary | ICD-10-CM

## 2018-06-22 LAB — URINALYSIS, ROUTINE W REFLEX MICROSCOPIC
Bilirubin Urine: NEGATIVE
Glucose, UA: NEGATIVE mg/dL
Hgb urine dipstick: NEGATIVE
Ketones, ur: NEGATIVE mg/dL
Leukocytes,Ua: NEGATIVE
Nitrite: NEGATIVE
Protein, ur: NEGATIVE mg/dL
Specific Gravity, Urine: 1.03 (ref 1.005–1.030)
pH: 5 (ref 5.0–8.0)

## 2018-06-22 LAB — TYPE AND SCREEN
ABO/RH(D): A POS
Antibody Screen: NEGATIVE

## 2018-06-22 NOTE — Discharge Instructions (Signed)
Round Ligament Pain  The round ligament is a cord of muscle and tissue that helps support the uterus. It can become a source of pain during pregnancy if it becomes stretched or twisted as the baby grows. The pain usually begins in the second trimester (13-28 weeks) of pregnancy, and it can come and go until the baby is delivered. It is not a serious problem, and it does not cause harm to the baby. Round ligament pain is usually a short, sharp, and pinching pain, but it can also be a dull, lingering, and aching pain. The pain is felt in the lower side of the abdomen or in the groin. It usually starts deep in the groin and moves up to the outside of the hip area. The pain may occur when you:  Suddenly change position, such as quickly going from a sitting to standing position.  Roll over in bed.  Cough or sneeze.  Do physical activity. Follow these instructions at home:   Watch your condition for any changes.  When the pain starts, relax. Then try any of these methods to help with the pain: ? Sitting down. ? Flexing your knees up to your abdomen. ? Lying on your side with one pillow under your abdomen and another pillow between your legs. ? Sitting in a warm bath for 15-20 minutes or until the pain goes away.  Take over-the-counter and prescription medicines only as told by your health care provider.  Move slowly when you sit down or stand up.  Avoid long walks if they cause pain.  Stop or reduce your physical activities if they cause pain.  Keep all follow-up visits as told by your health care provider. This is important. Contact a health care provider if:  Your pain does not go away with treatment.  You feel pain in your back that you did not have before.  Your medicine is not helping. Get help right away if:  You have a fever or chills.  You develop uterine contractions.  You have vaginal bleeding.  You have nausea or vomiting.  You have diarrhea.  You have pain  when you urinate. Summary  Round ligament pain is felt in the lower abdomen or groin. It is usually a short, sharp, and pinching pain. It can also be a dull, lingering, and aching pain.  This pain usually begins in the second trimester (13-28 weeks). It occurs because the uterus is stretching with the growing baby, and it is not harmful to the baby.  You may notice the pain when you suddenly change position, when you cough or sneeze, or during physical activity.  Relaxing, flexing your knees to your abdomen, lying on one side, or taking a warm bath may help to get rid of the pain.  Get help from your health care provider if the pain does not go away or if you have vaginal bleeding, nausea, vomiting, diarrhea, or painful urination. This information is not intended to replace advice given to you by your health care provider. Make sure you discuss any questions you have with your health care provider. Document Released: 11/04/2007 Document Revised: 07/13/2017 Document Reviewed: 07/13/2017 Elsevier Interactive Patient Education  2019 [employer] Inc[employer]    Vaginal Bleeding During Pregnancy, Third Trimester  A small amount of bleeding (spotting) from the vagina is common during pregnancy. Sometimes the bleeding is normal and is not a problem, and sometimes it is a sign of something serious. Tell your doctor about any bleeding from your vagina right  away. Follow these instructions at home: Activity  Follow your doctor's instructions about how active you can be. Your doctor may recommend that you: ? Stay in bed and only get up to use the bathroom. ? Continue light activity.  If needed, make plans for someone to help you with your normal activities.  Ask your doctor if it is safe for you to drive.  Do not lift anything that is heavier than 10 lb (4.5 kg) until your doctor says that this is safe.  Do not have sex or orgasms until your doctor says that this is safe. Medicines  Take  over-the-counter and prescription medicines only as told by your doctor.  Do not take aspirin. It can cause bleeding. General instructions  Watch your condition for any changes.  Write down: ? The number of pads you use each day. ? How often you change pads. ? How soaked (saturated) your pads are.  Do not use tampons.  Do not douche.  If you pass any tissue from your vagina, save the tissue to show your doctor.  Keep all follow-up visits as told by your doctor. This is important. Contact a doctor if:  You have vaginal bleeding at any time during pregnancy.  You have cramps.  You have a fever. Get help right away if:  You have very bad cramps.  You have very bad pain in your back or belly (abdomen).  You have a gush of fluid from your vagina.  You pass large clots or a lot of tissue from your vagina.  Your bleeding gets worse.  You feel light-headed or weak.  You pass out (faint).  Your baby is moving less than usual, or not moving at all. Summary  Tell your doctor about any bleeding from your vagina right away.  Follow instructions from your doctor about how active you can be. You may need someone to help you with your normal activities. This information is not intended to replace advice given to you by your health care provider. Make sure you discuss any questions you have with your health care provider. Document Released: 06/11/2013 Document Revised: 04/28/2016 Document Reviewed: 04/28/2016 Elsevier Interactive Patient Education  2019 [employer]

## 2018-06-22 NOTE — OB Triage Note (Signed)
  L&D OB Triage Note  SUBJECTIVE Tasha Macias is a 33 y.o. [id number] female at [redacted]w[redacted]d, EDD Estimated Date of Delivery: 08/22/18 who presented to triage with complaints of round ligament pain and musculoskeletal pain. She also complains of some light pink tinged d/c that occurred yesterday. The d/c has changed to brown and occurs with whipping only. She denies contractions, leaking of fluid , and feels good fetal movement.   OB History  Gravida Para Term Preterm AB Living  5 3 3  0 1 3  SAB TAB Ectopic Multiple Live Births  1 0 0 0 3    # Outcome Date GA Lbr Len/2nd Weight Sex Delivery Anes PTL Lv  5 Current           4 Term 05/26/11    F Vag-Spont   LIV  3 Term 03/08/10    M Vag-Spont   LIV  2 Term 08/17/07    M Vag-Spont   LIV  1 SAB 2008            Medications Prior to Admission  Medication Sig Dispense Refill Last Dose  . Prenatal Vit-Fe Fumarate-FA (PRENATAL MULTIVITAMIN) TABS tablet Take 1 tablet by mouth daily at 12 noon.   Past Month at Unknown time     OBJECTIVE  Nursing Evaluation:   BP 108/66 (BP Location: Right Arm)   Pulse 86   Temp 98.4 F (36.9 C) (Oral)   Resp 16   Ht 5\' 4"  (1.626 m)   Wt 86.2 kg   LMP 11/16/2017 (Exact Date) Comment: neg preg  BMI 32.61 kg/m    Findings:   Reactive NST, Musculoskeletal discomforts of pregnancy  NST was performed and has been reviewed by me.  NST INTERPRETATION: Category I  Mode: External Baseline Rate (A): 140 bpm(fetal monitoring d/c per A. Sheli Dorin CNM) Variability: Moderate Accelerations: 15 x 15 Decelerations: None     Contraction Frequency (min): None noted  ASSESSMENT Impression:  1.  Pregnancy:  [id number] at [redacted]w[redacted]d , EDD Estimated Date of Delivery: 08/22/18 2.  NST:  Category I  PLAN 1. Reassurance given, discuss bleeding precautions, red flag symptoms. Use of tylenol, heating pain, ice , and stretches to help with muscle pain.  2. Discharge home with standard labor precautions given to return to L&D or call  the office for problems. 3. Continue routine prenatal care.    Doreene Burke, CNM

## 2018-07-18 ENCOUNTER — Telehealth: Payer: Self-pay | Admitting: *Deleted

## 2018-07-18 NOTE — Telephone Encounter (Signed)
Coronavirus (COVID-19) Are you at risk?  Are you at risk for the Coronavirus (COVID-19)?  To be considered HIGH RISK for Coronavirus (COVID-19), you have to meet the following criteria:  . Traveled to [place], [place], [place], [place] or [place]; or in the [place] to [place], [place], [place], or [place]; and have fever, cough, and shortness of breath within the last 2 weeks of travel OR . Been in close contact with a person diagnosed with COVID-19 within the last 2 weeks and have fever, cough, and shortness of breath . IF YOU DO NOT MEET THESE CRITERIA, YOU ARE CONSIDERED LOW RISK FOR COVID-19.  What to do if you are HIGH RISK for COVID-19?  . If you are having a medical emergency, call 911. . Seek medical care right away. Before you go to a doctor's office, urgent care or emergency department, call ahead and tell them about your recent travel, contact with someone diagnosed with COVID-19, and your symptoms. You should receive instructions from your physician's office regarding next steps of care.  . When you arrive at healthcare provider, tell the healthcare staff immediately you have returned from visiting [place], [place], [place], [place] or [place]; or traveled in the [place] to [place], [place], [place], or [place]; in the last two weeks or you have been in close contact with a person diagnosed with COVID-19 in the last 2 weeks.   . Tell the health care staff about your symptoms: fever, cough and shortness of breath. . After you have been seen by a medical provider, you will be either: o Tested for (COVID-19) and discharged home on quarantine except to seek medical care if symptoms worsen, and asked to  - Stay home and avoid contact with others until you get your results (4-5 days)  - Avoid travel on public transportation if possible (such as bus, train, or airplane) or o Sent to the Emergency Department by EMS for evaluation, COVID-19 testing, and possible  admission depending on your condition and test results.  What to do if you are LOW RISK for COVID-19?  Reduce your risk of any infection by using the same precautions used for avoiding the common cold or flu:  . Wash your hands often with soap and warm water for at least 20 seconds.  If soap and water are not readily available, use an alcohol-based hand sanitizer with at least 60% alcohol.  . If coughing or sneezing, cover your mouth and nose by coughing or sneezing into the elbow areas of your shirt or coat, into a tissue or into your sleeve (not your hands). . Avoid shaking hands with others and consider head nods or verbal greetings only. . Avoid touching your eyes, nose, or mouth with unwashed hands.  . Avoid close contact with people who are sick. . Avoid places or events with large numbers of people in one location, like concerts or sporting events. . Carefully consider travel plans you have or are making. . If you are planning any travel outside or inside the [place], visit the CDC's Travelers' Health webpage for the latest health notices. . If you have some symptoms but not all symptoms, continue to monitor at home and seek medical attention if your symptoms worsen. . If you are having a medical emergency, call 911.   ADDITIONAL HEALTHCARE OPTIONS FOR PATIENTS  [place] [facility] / e-Visit: [email or web address]         [facility]: [phone]  [place]   [facility]: [phone]                   [facility]: [phone]   Spoke with pt denies any sx.  Tasha Macias, CMA 

## 2018-07-19 ENCOUNTER — Ambulatory Visit (INDEPENDENT_AMBULATORY_CARE_PROVIDER_SITE_OTHER): Payer: Medicaid Other | Admitting: Obstetrics and Gynecology

## 2018-07-19 ENCOUNTER — Other Ambulatory Visit: Payer: Self-pay

## 2018-07-19 VITALS — BP 109/66 | HR 83 | Wt 203.6 lb

## 2018-07-19 DIAGNOSIS — Z3493 Encounter for supervision of normal pregnancy, unspecified, third trimester: Secondary | ICD-10-CM

## 2018-07-19 LAB — POCT URINALYSIS DIPSTICK OB
Bilirubin, UA: NEGATIVE
Blood, UA: NEGATIVE
Glucose, UA: NEGATIVE
Ketones, UA: NEGATIVE
Leukocytes, UA: NEGATIVE
Nitrite, UA: NEGATIVE
POC,PROTEIN,UA: NEGATIVE
Spec Grav, UA: 1.02 (ref 1.010–1.025)
Urobilinogen, UA: 0.2 E.U./dL
pH, UA: 6 (ref 5.0–8.0)

## 2018-07-19 NOTE — Progress Notes (Signed)
ROB- pt is having some pelvic pressure 

## 2018-07-19 NOTE — Progress Notes (Signed)
ROB-doing well, some ligament pain, stays at home with other children, has stopped baby aspirin, discussed covid restrictions.

## 2018-07-25 ENCOUNTER — Ambulatory Visit (INDEPENDENT_AMBULATORY_CARE_PROVIDER_SITE_OTHER): Payer: Medicaid Other | Admitting: Certified Nurse Midwife

## 2018-07-25 ENCOUNTER — Other Ambulatory Visit: Payer: Self-pay

## 2018-07-25 VITALS — BP 110/73 | HR 95 | Wt 205.1 lb

## 2018-07-25 DIAGNOSIS — Z3685 Encounter for antenatal screening for Streptococcus B: Secondary | ICD-10-CM

## 2018-07-25 DIAGNOSIS — Z3493 Encounter for supervision of normal pregnancy, unspecified, third trimester: Secondary | ICD-10-CM

## 2018-07-25 DIAGNOSIS — Z113 Encounter for screening for infections with a predominantly sexual mode of transmission: Secondary | ICD-10-CM

## 2018-07-25 LAB — OB RESULTS CONSOLE GC/CHLAMYDIA: Gonorrhea: NEGATIVE

## 2018-07-25 NOTE — Patient Instructions (Signed)

## 2018-07-25 NOTE — Progress Notes (Signed)
ROB-Reports intermittent lower back and pelvic pain x two (2) weeks. Discussed home treatment measures including the use of abdominal support. Requests SVE. 36 wk cultures collected. Herbal prep and Illustrated Birth Choices handouts given. Anticipatory guidance regarding course of prenatal care. Reviewed red flag symptoms and when to call. RTC x 1 week for ROB or sooner if needed.

## 2018-07-25 NOTE — Progress Notes (Signed)
ROB-Patient c/o intermittent lower pelvic and back pain x2 weeks.

## 2018-07-25 NOTE — Addendum Note (Signed)
Addended by: Edrick Oh J on: 07/25/2018 12:10 PM   Modules accepted: Orders

## 2018-07-27 LAB — STREP GP B NAA: Strep Gp B NAA: NEGATIVE

## 2018-07-28 ENCOUNTER — Telehealth: Payer: Self-pay

## 2018-07-28 NOTE — Telephone Encounter (Signed)
Pt prescreened no symptoms do not have face mask.  Coronavirus (COVID-19) Are you at risk?  Are you at risk for the Coronavirus (COVID-19)?  To be considered HIGH RISK for Coronavirus (COVID-19), you have to meet the following criteria:  . Traveled to [place], [place], [place], [place] or [place]; or in the [place] to [place], [place], [place], or [place]; and have fever, cough, and shortness of breath within the last 2 weeks of travel OR . Been in close contact with a person diagnosed with COVID-19 within the last 2 weeks and have fever, cough, and shortness of breath . IF YOU DO NOT MEET THESE CRITERIA, YOU ARE CONSIDERED LOW RISK FOR COVID-19.  What to do if you are HIGH RISK for COVID-19?  Marland Kitchen If you are having a medical emergency, call 911. . Seek medical care right away. Before you go to a doctor's office, urgent care or emergency department, call ahead and tell them about your recent travel, contact with someone diagnosed with COVID-19, and your symptoms. You should receive instructions from your physician's office regarding next steps of care.  . When you arrive at healthcare provider, tell the healthcare staff immediately you have returned from visiting [place], [place], [place], [place] or [place]; or traveled in the [place] to [place], [place], [place], or [place]; in the last two weeks or you have been in close contact with a person diagnosed with COVID-19 in the last 2 weeks.   . Tell the health care staff about your symptoms: fever, cough and shortness of breath. . After you have been seen by a medical provider, you will be either: o Tested for (COVID-19) and discharged home on quarantine except to seek medical care if symptoms worsen, and asked to  - Stay home and avoid contact with others until you get your results (4-5 days)  - Avoid travel on public transportation if possible (such as bus, train, or airplane) or o Sent to the Emergency Department by EMS  for evaluation, COVID-19 testing, and possible admission depending on your condition and test results.  What to do if you are LOW RISK for COVID-19?  Reduce your risk of any infection by using the same precautions used for avoiding the common cold or flu:  Marland Kitchen Wash your hands often with soap and warm water for at least 20 seconds.  If soap and water are not readily available, use an alcohol-based hand sanitizer with at least 60% alcohol.  . If coughing or sneezing, cover your mouth and nose by coughing or sneezing into the elbow areas of your shirt or coat, into a tissue or into your sleeve (not your hands). . Avoid shaking hands with others and consider head nods or verbal greetings only. . Avoid touching your eyes, nose, or mouth with unwashed hands.  . Avoid close contact with people who are sick. . Avoid places or events with large numbers of people in one location, like concerts or sporting events. . Carefully consider travel plans you have or are making. . If you are planning any travel outside or inside the [place], visit the CDC's Travelers' Health webpage for the latest health notices. . If you have some symptoms but not all symptoms, continue to monitor at home and seek medical attention if your symptoms worsen. . If you are having a medical emergency, call 911.   ADDITIONAL HEALTHCARE OPTIONS FOR PATIENTS  [place] [facility] / e-Visit: [email or web address]  [facility]: [phone]  [facility]: [phone]                   [facility]: [phone]  

## 2018-07-29 LAB — GC/CHLAMYDIA PROBE AMP
Chlamydia trachomatis, NAA: NEGATIVE
Neisseria Gonorrhoeae by PCR: NEGATIVE

## 2018-07-31 ENCOUNTER — Other Ambulatory Visit: Payer: Self-pay

## 2018-07-31 ENCOUNTER — Encounter: Payer: Self-pay | Admitting: Certified Nurse Midwife

## 2018-07-31 ENCOUNTER — Ambulatory Visit (INDEPENDENT_AMBULATORY_CARE_PROVIDER_SITE_OTHER): Payer: Medicaid Other | Admitting: Certified Nurse Midwife

## 2018-07-31 VITALS — BP 94/56 | HR 90 | Wt 208.5 lb

## 2018-07-31 DIAGNOSIS — Z3493 Encounter for supervision of normal pregnancy, unspecified, third trimester: Secondary | ICD-10-CM

## 2018-07-31 LAB — POCT URINALYSIS DIPSTICK OB
Bilirubin, UA: NEGATIVE
Blood, UA: NEGATIVE
Glucose, UA: NEGATIVE
Ketones, UA: NEGATIVE
Leukocytes, UA: NEGATIVE
Nitrite, UA: NEGATIVE
POC,PROTEIN,UA: NEGATIVE
Spec Grav, UA: 1.025 (ref 1.010–1.025)
Urobilinogen, UA: 0.2 E.U./dL
pH, UA: 5 (ref 5.0–8.0)

## 2018-07-31 NOTE — Progress Notes (Signed)
ROB doing well. Feels good fetal movement. Labor precautions reviewed. Labor prep handout given. SVE per pt request 1/50/-2. Follow up 1 wk.   Philip Aspen, CNM

## 2018-07-31 NOTE — Patient Instructions (Signed)
Braxton Hicks Contractions Contractions of the uterus can occur throughout pregnancy, but they are not always a sign that you are in labor. You may have practice contractions called Braxton Hicks contractions. These false labor contractions are sometimes confused with true labor. What are Braxton Hicks contractions? Braxton Hicks contractions are tightening movements that occur in the muscles of the uterus before labor. Unlike true labor contractions, these contractions do not result in opening (dilation) and thinning of the cervix. Toward the end of pregnancy (32-34 weeks), Braxton Hicks contractions can happen more often and may become stronger. These contractions are sometimes difficult to tell apart from true labor because they can be very uncomfortable. You should not feel embarrassed if you go to the hospital with false labor. Sometimes, the only way to tell if you are in true labor is for your health care provider to look for changes in the cervix. The health care provider will do a physical exam and may monitor your contractions. If you are not in true labor, the exam should show that your cervix is not dilating and your water has not broken. If there are no other health problems associated with your pregnancy, it is completely safe for you to be sent home with false labor. You may continue to have Braxton Hicks contractions until you go into true labor. How to tell the difference between true labor and false labor True labor  Contractions last 30-70 seconds.  Contractions become very regular.  Discomfort is usually felt in the top of the uterus, and it spreads to the lower abdomen and low back.  Contractions do not go away with walking.  Contractions usually become more intense and increase in frequency.  The cervix dilates and gets thinner. False labor  Contractions are usually shorter and not as strong as true labor contractions.  Contractions are usually irregular.  Contractions  are often felt in the front of the lower abdomen and in the groin.  Contractions may go away when you walk around or change positions while lying down.  Contractions get weaker and are shorter-lasting as time goes on.  The cervix usually does not dilate or become thin. Follow these instructions at home:   Take over-the-counter and prescription medicines only as told by your health care provider.  Keep up with your usual exercises and follow other instructions from your health care provider.  Eat and drink lightly if you think you are going into labor.  If Braxton Hicks contractions are making you uncomfortable: ? Change your position from lying down or resting to walking, or change from walking to resting. ? Sit and rest in a tub of warm water. ? Drink enough fluid to keep your urine pale yellow. Dehydration may cause these contractions. ? Do slow and deep breathing several times an hour.  Keep all follow-up prenatal visits as told by your health care provider. This is important. Contact a health care provider if:  You have a fever.  You have continuous pain in your abdomen. Get help right away if:  Your contractions become stronger, more regular, and closer together.  You have fluid leaking or gushing from your vagina.  You pass blood-tinged mucus (bloody show).  You have bleeding from your vagina.  You have low back pain that you never had before.  You feel your baby's head pushing down and causing pelvic pressure.  Your baby is not moving inside you as much as it used to. Summary  Contractions that occur before labor are   called Braxton Hicks contractions, false labor, or practice contractions.  Braxton Hicks contractions are usually shorter, weaker, farther apart, and less regular than true labor contractions. True labor contractions usually become progressively stronger and regular, and they become more frequent.  Manage discomfort from Braxton Hicks contractions  by changing position, resting in a warm bath, drinking plenty of water, or practicing deep breathing. This information is not intended to replace advice given to you by your health care provider. Make sure you discuss any questions you have with your health care provider. Document Released: 06/10/2016 Document Revised: 11/09/2016 Document Reviewed: 06/10/2016 Elsevier Interactive Patient Education  2019 [employer] Inc.  

## 2018-08-02 DIAGNOSIS — O1200 Gestational edema, unspecified trimester: Secondary | ICD-10-CM | POA: Diagnosis not present

## 2018-08-07 ENCOUNTER — Telehealth: Payer: Self-pay

## 2018-08-07 NOTE — Telephone Encounter (Signed)
Coronavirus (COVID-19) Are you at risk?  Are you at risk for the Coronavirus (COVID-19)?  To be considered HIGH RISK for Coronavirus (COVID-19), you have to meet the following criteria:  . Traveled to [place], [place], [place], [place] or [place]; or in the [place] to [place], [place], [place], or [place]; and have fever, cough, and shortness of breath within the last 2 weeks of travel OR . Been in close contact with a person diagnosed with COVID-19 within the last 2 weeks and have fever, cough, and shortness of breath . IF YOU DO NOT MEET THESE CRITERIA, YOU ARE CONSIDERED LOW RISK FOR COVID-19.  What to do if you are HIGH RISK for COVID-19?  . If you are having a medical emergency, call 911. . Seek medical care right away. Before you go to a doctor's office, urgent care or emergency department, call ahead and tell them about your recent travel, contact with someone diagnosed with COVID-19, and your symptoms. You should receive instructions from your physician's office regarding next steps of care.  . When you arrive at healthcare provider, tell the healthcare staff immediately you have returned from visiting [place], [place], [place], [place] or [place]; or traveled in the [place] to [place], [place], [place], or [place]; in the last two weeks or you have been in close contact with a person diagnosed with COVID-19 in the last 2 weeks.   . Tell the health care staff about your symptoms: fever, cough and shortness of breath. . After you have been seen by a medical provider, you will be either: o Tested for (COVID-19) and discharged home on quarantine except to seek medical care if symptoms worsen, and asked to  - Stay home and avoid contact with others until you get your results (4-5 days)  - Avoid travel on public transportation if possible (such as bus, train, or airplane) or o Sent to the Emergency Department by EMS for evaluation, COVID-19 testing, and possible  admission depending on your condition and test results.  What to do if you are LOW RISK for COVID-19?  Reduce your risk of any infection by using the same precautions used for avoiding the common cold or flu:  . Wash your hands often with soap and warm water for at least 20 seconds.  If soap and water are not readily available, use an alcohol-based hand sanitizer with at least 60% alcohol.  . If coughing or sneezing, cover your mouth and nose by coughing or sneezing into the elbow areas of your shirt or coat, into a tissue or into your sleeve (not your hands). . Avoid shaking hands with others and consider head nods or verbal greetings only. . Avoid touching your eyes, nose, or mouth with unwashed hands.  . Avoid close contact with people who are sick. . Avoid places or events with large numbers of people in one location, like concerts or sporting events. . Carefully consider travel plans you have or are making. . If you are planning any travel outside or inside the [place], visit the CDC's Travelers' Health webpage for the latest health notices. . If you have some symptoms but not all symptoms, continue to monitor at home and seek medical attention if your symptoms worsen. . If you are having a medical emergency, call 911.   ADDITIONAL HEALTHCARE OPTIONS FOR PATIENTS  [facility] / e-Visit: [email or web address]         [facility]: [phone]  [facility]: [phone]                   [facility]: [phone]   Pre-screen negative, DM.   

## 2018-08-08 ENCOUNTER — Encounter: Payer: Medicaid Other | Admitting: Obstetrics and Gynecology

## 2018-08-09 ENCOUNTER — Other Ambulatory Visit: Payer: Self-pay

## 2018-08-09 ENCOUNTER — Encounter: Payer: Self-pay | Admitting: Certified Nurse Midwife

## 2018-08-09 ENCOUNTER — Ambulatory Visit (INDEPENDENT_AMBULATORY_CARE_PROVIDER_SITE_OTHER): Payer: Medicaid Other | Admitting: Certified Nurse Midwife

## 2018-08-09 VITALS — BP 99/57 | HR 84 | Wt 211.0 lb

## 2018-08-09 DIAGNOSIS — Z3480 Encounter for supervision of other normal pregnancy, unspecified trimester: Secondary | ICD-10-CM

## 2018-08-09 NOTE — Progress Notes (Signed)
ROB doing well. Feels good movement. Labor precautions reviewed. Discussed induction 41 wks if needed. Follow up 1 wks with Melody .   Philip Aspen, CNM

## 2018-08-09 NOTE — Patient Instructions (Signed)
Braxton Hicks Contractions Contractions of the uterus can occur throughout pregnancy, but they are not always a sign that you are in labor. You may have practice contractions called Braxton Hicks contractions. These false labor contractions are sometimes confused with true labor. What are Braxton Hicks contractions? Braxton Hicks contractions are tightening movements that occur in the muscles of the uterus before labor. Unlike true labor contractions, these contractions do not result in opening (dilation) and thinning of the cervix. Toward the end of pregnancy (32-34 weeks), Braxton Hicks contractions can happen more often and may become stronger. These contractions are sometimes difficult to tell apart from true labor because they can be very uncomfortable. You should not feel embarrassed if you go to the hospital with false labor. Sometimes, the only way to tell if you are in true labor is for your health care provider to look for changes in the cervix. The health care provider will do a physical exam and may monitor your contractions. If you are not in true labor, the exam should show that your cervix is not dilating and your water has not broken. If there are no other health problems associated with your pregnancy, it is completely safe for you to be sent home with false labor. You may continue to have Braxton Hicks contractions until you go into true labor. How to tell the difference between true labor and false labor True labor  Contractions last 30-70 seconds.  Contractions become very regular.  Discomfort is usually felt in the top of the uterus, and it spreads to the lower abdomen and low back.  Contractions do not go away with walking.  Contractions usually become more intense and increase in frequency.  The cervix dilates and gets thinner. False labor  Contractions are usually shorter and not as strong as true labor contractions.  Contractions are usually irregular.  Contractions  are often felt in the front of the lower abdomen and in the groin.  Contractions may go away when you walk around or change positions while lying down.  Contractions get weaker and are shorter-lasting as time goes on.  The cervix usually does not dilate or become thin. Follow these instructions at home:   Take over-the-counter and prescription medicines only as told by your health care provider.  Keep up with your usual exercises and follow other instructions from your health care provider.  Eat and drink lightly if you think you are going into labor.  If Braxton Hicks contractions are making you uncomfortable: ? Change your position from lying down or resting to walking, or change from walking to resting. ? Sit and rest in a tub of warm water. ? Drink enough fluid to keep your urine pale yellow. Dehydration may cause these contractions. ? Do slow and deep breathing several times an hour.  Keep all follow-up prenatal visits as told by your health care provider. This is important. Contact a health care provider if:  You have a fever.  You have continuous pain in your abdomen. Get help right away if:  Your contractions become stronger, more regular, and closer together.  You have fluid leaking or gushing from your vagina.  You pass blood-tinged mucus (bloody show).  You have bleeding from your vagina.  You have low back pain that you never had before.  You feel your baby's head pushing down and causing pelvic pressure.  Your baby is not moving inside you as much as it used to. Summary  Contractions that occur before labor are   called Braxton Hicks contractions, false labor, or practice contractions.  Braxton Hicks contractions are usually shorter, weaker, farther apart, and less regular than true labor contractions. True labor contractions usually become progressively stronger and regular, and they become more frequent.  Manage discomfort from Braxton Hicks contractions  by changing position, resting in a warm bath, drinking plenty of water, or practicing deep breathing. This information is not intended to replace advice given to you by your health care provider. Make sure you discuss any questions you have with your health care provider. Document Released: 06/10/2016 Document Revised: 01/07/2017 Document Reviewed: 06/10/2016 Elsevier Patient Education  2020 [employer].  

## 2018-08-17 ENCOUNTER — Ambulatory Visit (INDEPENDENT_AMBULATORY_CARE_PROVIDER_SITE_OTHER): Payer: Medicaid Other | Admitting: Certified Nurse Midwife

## 2018-08-17 ENCOUNTER — Encounter: Payer: Self-pay | Admitting: Certified Nurse Midwife

## 2018-08-17 ENCOUNTER — Other Ambulatory Visit: Payer: Self-pay

## 2018-08-17 VITALS — BP 118/71 | HR 88 | Wt 214.3 lb

## 2018-08-17 DIAGNOSIS — O480 Post-term pregnancy: Secondary | ICD-10-CM

## 2018-08-17 DIAGNOSIS — Z3493 Encounter for supervision of normal pregnancy, unspecified, third trimester: Secondary | ICD-10-CM

## 2018-08-17 LAB — POCT URINALYSIS DIPSTICK OB
Bilirubin, UA: NEGATIVE
Blood, UA: NEGATIVE
Glucose, UA: NEGATIVE
Ketones, UA: NEGATIVE
Leukocytes, UA: NEGATIVE
Nitrite, UA: NEGATIVE
Spec Grav, UA: >=1.03 — AB (ref 1.010–1.025)
Urobilinogen, UA: 0.2 E.U./dL
pH, UA: 6 (ref 5.0–8.0)

## 2018-08-17 NOTE — Patient Instructions (Signed)

## 2018-08-17 NOTE — Progress Notes (Signed)
ROB-Pt present for prenatal care. Pt stated that she is having some vaginal pain.

## 2018-08-17 NOTE — Progress Notes (Signed)
ROB-Doing well, reports vaginal pain. Declines SVE. Anticipatory guidance regarding postdates care. Reviewed red flag symptoms and when to call. RTC x 1 week for BPP/Growth ultrasound and ROB or sooner if needed.

## 2018-08-23 ENCOUNTER — Inpatient Hospital Stay: Admit: 2018-08-23 | Payer: Self-pay

## 2018-08-25 ENCOUNTER — Ambulatory Visit: Payer: Medicaid Other

## 2018-08-25 ENCOUNTER — Other Ambulatory Visit: Payer: Self-pay

## 2018-08-25 ENCOUNTER — Observation Stay
Admission: EM | Admit: 2018-08-25 | Discharge: 2018-08-25 | Disposition: A | Discharge status: Home / Self Care | Payer: Medicaid Other | Attending: Certified Nurse Midwife | Admitting: Certified Nurse Midwife

## 2018-08-25 ENCOUNTER — Ambulatory Visit (INDEPENDENT_AMBULATORY_CARE_PROVIDER_SITE_OTHER): Payer: Medicaid Other | Admitting: Certified Nurse Midwife

## 2018-08-25 ENCOUNTER — Other Ambulatory Visit: Payer: Self-pay | Admitting: Certified Nurse Midwife

## 2018-08-25 ENCOUNTER — Inpatient Hospital Stay: Payer: Medicaid Other

## 2018-08-25 VITALS — BP 102/59 | HR 88 | Wt 219.4 lb

## 2018-08-25 DIAGNOSIS — O358XX0 Maternal care for other (suspected) fetal abnormality and damage, not applicable or unspecified: Secondary | ICD-10-CM

## 2018-08-25 DIAGNOSIS — O480 Post-term pregnancy: Secondary | ICD-10-CM

## 2018-08-25 DIAGNOSIS — Z3A40 40 weeks gestation of pregnancy: Secondary | ICD-10-CM | POA: Diagnosis not present

## 2018-08-25 DIAGNOSIS — O35EXX0 Maternal care for other (suspected) fetal abnormality and damage, fetal genitourinary anomalies, not applicable or unspecified: Secondary | ICD-10-CM

## 2018-08-25 DIAGNOSIS — Z3493 Encounter for supervision of normal pregnancy, unspecified, third trimester: Secondary | ICD-10-CM

## 2018-08-25 DIAGNOSIS — O288 Other abnormal findings on antenatal screening of mother: Secondary | ICD-10-CM

## 2018-08-25 LAB — POCT URINALYSIS DIPSTICK OB
Bilirubin, UA: NEGATIVE
Blood, UA: NEGATIVE
Glucose, UA: NEGATIVE
Ketones, UA: NEGATIVE
Leukocytes, UA: NEGATIVE
Nitrite, UA: NEGATIVE
POC,PROTEIN,UA: NEGATIVE
Spec Grav, UA: 1.01 (ref 1.010–1.025)
Urobilinogen, UA: 0.2 E.U./dL
pH, UA: 6 (ref 5.0–8.0)

## 2018-08-25 NOTE — Patient Instructions (Signed)
Braxton Hicks Contractions Contractions of the uterus can occur throughout pregnancy, but they are not always a sign that you are in labor. You may have practice contractions called Braxton Hicks contractions. These false labor contractions are sometimes confused with true labor. What are Braxton Hicks contractions? Braxton Hicks contractions are tightening movements that occur in the muscles of the uterus before labor. Unlike true labor contractions, these contractions do not result in opening (dilation) and thinning of the cervix. Toward the end of pregnancy (32-34 weeks), Braxton Hicks contractions can happen more often and may become stronger. These contractions are sometimes difficult to tell apart from true labor because they can be very uncomfortable. You should not feel embarrassed if you go to the hospital with false labor. Sometimes, the only way to tell if you are in true labor is for your health care provider to look for changes in the cervix. The health care provider will do a physical exam and may monitor your contractions. If you are not in true labor, the exam should show that your cervix is not dilating and your water has not broken. If there are no other health problems associated with your pregnancy, it is completely safe for you to be sent home with false labor. You may continue to have Braxton Hicks contractions until you go into true labor. How to tell the difference between true labor and false labor True labor  Contractions last 30-70 seconds.  Contractions become very regular.  Discomfort is usually felt in the top of the uterus, and it spreads to the lower abdomen and low back.  Contractions do not go away with walking.  Contractions usually become more intense and increase in frequency.  The cervix dilates and gets thinner. False labor  Contractions are usually shorter and not as strong as true labor contractions.  Contractions are usually irregular.  Contractions  are often felt in the front of the lower abdomen and in the groin.  Contractions may go away when you walk around or change positions while lying down.  Contractions get weaker and are shorter-lasting as time goes on.  The cervix usually does not dilate or become thin. Follow these instructions at home:   Take over-the-counter and prescription medicines only as told by your health care provider.  Keep up with your usual exercises and follow other instructions from your health care provider.  Eat and drink lightly if you think you are going into labor.  If Braxton Hicks contractions are making you uncomfortable: ? Change your position from lying down or resting to walking, or change from walking to resting. ? Sit and rest in a tub of warm water. ? Drink enough fluid to keep your urine pale yellow. Dehydration may cause these contractions. ? Do slow and deep breathing several times an hour.  Keep all follow-up prenatal visits as told by your health care provider. This is important. Contact a health care provider if:  You have a fever.  You have continuous pain in your abdomen. Get help right away if:  Your contractions become stronger, more regular, and closer together.  You have fluid leaking or gushing from your vagina.  You pass blood-tinged mucus (bloody show).  You have bleeding from your vagina.  You have low back pain that you never had before.  You feel your baby's head pushing down and causing pelvic pressure.  Your baby is not moving inside you as much as it used to. Summary  Contractions that occur before labor are   called Braxton Hicks contractions, false labor, or practice contractions.  Braxton Hicks contractions are usually shorter, weaker, farther apart, and less regular than true labor contractions. True labor contractions usually become progressively stronger and regular, and they become more frequent.  Manage discomfort from Braxton Hicks contractions  by changing position, resting in a warm bath, drinking plenty of water, or practicing deep breathing. This information is not intended to replace advice given to you by your health care provider. Make sure you discuss any questions you have with your health care provider. Document Released: 06/10/2016 Document Revised: 01/07/2017 Document Reviewed: 06/10/2016 Elsevier Patient Education  2020 [employer].  

## 2018-08-25 NOTE — OB Triage Note (Signed)
   L&D OB Triage Note  SUBJECTIVE Tasha Macias is a 33 y.o. [phone] female at [redacted]w[redacted]d, EDD Estimated Date of Delivery: 08/22/18 who presented to triage with complaints for prolonged Fetal monitoring due to non reactive strip in office. NST for post dates.   OB History  Gravida Para Term Preterm AB Living  5 3 3  0 1 3  SAB TAB Ectopic Multiple Live Births  1 0 0 0 3    # Outcome Date GA Lbr Len/2nd Weight Sex Delivery Anes PTL Lv  5 Current           4 Term 05/26/11    F Vag-Spont   LIV  3 Term 03/08/10    M Vag-Spont   LIV  2 Term 08/17/07    M Vag-Spont   LIV  1 SAB 2008            Medications Prior to Admission  Medication Sig Dispense Refill Last Dose  . Prenatal Vit-Fe Fumarate-FA (PRENATAL MULTIVITAMIN) TABS tablet Take 1 tablet by mouth daily at 12 noon.   Past Month at Unknown time     OBJECTIVE  Nursing Evaluation:   BP (!) 106/56   Pulse 92   Temp 98.7 F (37.1 C) (Oral)   Resp 17   LMP 11/16/2017 (Exact Date) Comment: neg preg   Findings:  BPP8/8, Reactive NST   NST was performed and has been reviewed by me.  NST INTERPRETATION: Category I  Mode: External Baseline Rate (A): 150 bpm Variability: Moderate Accelerations: 15 x 15 Decelerations: None     Contraction Frequency (min): occ with UI  ASSESSMENT Impression:  1.  Pregnancy:  [id number] at [redacted]w[redacted]d , EDD Estimated Date of Delivery: 08/22/18 2.  NST:  Category I  PLAN 1. Reassurance given, pt given the option to induce today. She declines.  2. Discharge home with standard labor precautions given to return to L&D or call the office for problems.  3. Continue routine prenatal care, follow up appointment on Monday , induction scheduled for Wednesday morning.   Philip Aspen, CNM

## 2018-08-25 NOTE — Progress Notes (Signed)
ROB pt post dates NST today non reactive baseline 140's with Accls to 150, occasional variable decels noted. Accustic stim used x 2 without 15 X 15 contractions noted. Pt sent to L&D for prolonged monitoring.   Tasha Macias, CNM

## 2018-08-25 NOTE — Progress Notes (Signed)
Pt was sent over from the office for non reactive NST. [redacted]w[redacted]d G5P3 reports +FM. Denies LOF. Reports small amount of bleeding since her cervix ws checked. Reports CTX 7/10 in her lower abd. No compliactions this pregnancy. No other concerns. VSS. Monitors applied. Will continue to monitor.

## 2018-08-28 ENCOUNTER — Other Ambulatory Visit: Payer: Self-pay

## 2018-08-28 ENCOUNTER — Ambulatory Visit (INDEPENDENT_AMBULATORY_CARE_PROVIDER_SITE_OTHER): Payer: Medicaid Other | Admitting: Certified Nurse Midwife

## 2018-08-28 ENCOUNTER — Ambulatory Visit
Admission: RE | Admit: 2018-08-28 | Discharge: 2018-08-28 | Disposition: A | Discharge status: Home / Self Care | Payer: Medicaid Other | Source: Ambulatory Visit | Attending: Certified Nurse Midwife | Admitting: Certified Nurse Midwife

## 2018-08-28 ENCOUNTER — Encounter: Payer: Self-pay | Admitting: Certified Nurse Midwife

## 2018-08-28 ENCOUNTER — Other Ambulatory Visit: Payer: Medicaid Other

## 2018-08-28 ENCOUNTER — Inpatient Hospital Stay
Admission: EM | Admit: 2018-08-28 | Discharge: 2018-08-31 | DRG: 807 | Disposition: A | Discharge status: Home / Self Care | Payer: Medicaid Other | Attending: Certified Nurse Midwife | Admitting: Certified Nurse Midwife

## 2018-08-28 DIAGNOSIS — O35EXX0 Maternal care for other (suspected) fetal abnormality and damage, fetal genitourinary anomalies, not applicable or unspecified: Secondary | ICD-10-CM

## 2018-08-28 DIAGNOSIS — O480 Post-term pregnancy: Principal | ICD-10-CM | POA: Diagnosis present

## 2018-08-28 DIAGNOSIS — Z3A40 40 weeks gestation of pregnancy: Secondary | ICD-10-CM

## 2018-08-28 DIAGNOSIS — O358XX0 Maternal care for other (suspected) fetal abnormality and damage, not applicable or unspecified: Secondary | ICD-10-CM

## 2018-08-28 DIAGNOSIS — Z1159 Encounter for screening for other viral diseases: Secondary | ICD-10-CM

## 2018-08-28 DIAGNOSIS — Z87891 Personal history of nicotine dependence: Secondary | ICD-10-CM | POA: Diagnosis not present

## 2018-08-28 DIAGNOSIS — Z3483 Encounter for supervision of other normal pregnancy, third trimester: Secondary | ICD-10-CM | POA: Diagnosis not present

## 2018-08-28 DIAGNOSIS — Z8759 Personal history of other complications of pregnancy, childbirth and the puerperium: Secondary | ICD-10-CM

## 2018-08-28 DIAGNOSIS — Z3A41 41 weeks gestation of pregnancy: Secondary | ICD-10-CM | POA: Diagnosis not present

## 2018-08-28 LAB — SARS CORONAVIRUS 2 BY RT PCR (HOSPITAL ORDER, PERFORMED IN ~~LOC~~ HOSPITAL LAB): SARS Coronavirus 2: NEGATIVE

## 2018-08-28 LAB — CBC
HCT: 33.1 % — ABNORMAL LOW (ref 36.0–46.0)
Hemoglobin: 10.7 g/dL — ABNORMAL LOW (ref 12.0–15.0)
MCH: 30.1 pg (ref 26.0–34.0)
MCHC: 32.3 g/dL (ref 30.0–36.0)
MCV: 93.2 fL (ref 80.0–100.0)
Platelets: 180 10*3/uL (ref 150–400)
RBC: 3.55 MIL/uL — ABNORMAL LOW (ref 3.87–5.11)
RDW: 13.5 % (ref 11.5–15.5)
WBC: 7.2 10*3/uL (ref 4.0–10.5)
nRBC: 0 % (ref 0.0–0.2)

## 2018-08-28 LAB — TYPE AND SCREEN
ABO/RH(D): A POS
Antibody Screen: NEGATIVE

## 2018-08-28 LAB — SARS CORONAVIRUS 2 (TAT 6-24 HRS): SARS Coronavirus 2: NEGATIVE

## 2018-08-28 MED ORDER — LACTATED RINGERS IV SOLN
500.0000 mL | INTRAVENOUS | Status: DC | PRN
  Administered 2018-08-29 (×2): 500 mL via INTRAVENOUS

## 2018-08-28 MED ORDER — MISOPROSTOL 25 MCG QUARTER TABLET
ORAL_TABLET | ORAL | Status: AC
  Filled 2018-08-28: qty 1

## 2018-08-28 MED ORDER — MISOPROSTOL 50MCG HALF TABLET
ORAL_TABLET | ORAL | Status: AC
  Filled 2018-08-28: qty 1

## 2018-08-28 MED ORDER — OXYTOCIN 40 UNITS IN NORMAL SALINE INFUSION - SIMPLE MED
2.5000 [IU]/h | INTRAVENOUS | Status: DC
  Administered 2018-08-29: 16:00:00 2.5 [IU]/h via INTRAVENOUS
  Filled 2018-08-28: qty 1000

## 2018-08-28 MED ORDER — SOD CITRATE-CITRIC ACID 500-334 MG/5ML PO SOLN: 30.0000 mL | ORAL | Status: DC | PRN

## 2018-08-28 MED ORDER — SODIUM CHLORIDE (PF) 0.9 % IJ SOLN
INTRAMUSCULAR | Status: AC
  Administered 2018-08-29: 03:00:00 3 mL via INTRAVENOUS
  Filled 2018-08-28: qty 50

## 2018-08-28 MED ORDER — MISOPROSTOL 50MCG HALF TABLET
50.0000 ug | ORAL_TABLET | ORAL | Status: DC
  Administered 2018-08-29: 50 ug via VAGINAL
  Filled 2018-08-28 (×2): qty 1

## 2018-08-28 MED ORDER — SODIUM CHLORIDE 0.9% FLUSH
3.0000 mL | Freq: Two times a day (BID) | INTRAVENOUS | Status: DC
  Administered 2018-08-29 (×2): 3 mL via INTRAVENOUS

## 2018-08-28 MED ORDER — ACETAMINOPHEN 325 MG PO TABS: 650.0000 mg | ORAL_TABLET | ORAL | Status: DC | PRN

## 2018-08-28 MED ORDER — LACTATED RINGERS IV SOLN
INTRAVENOUS | Status: DC
  Administered 2018-08-28: 17:00:00 via INTRAVENOUS

## 2018-08-28 MED ORDER — TERBUTALINE SULFATE 1 MG/ML IJ SOLN: 0.2500 mg | Freq: Once | INTRAMUSCULAR | Status: DC | PRN

## 2018-08-28 MED ORDER — OXYTOCIN BOLUS FROM INFUSION
500.0000 mL | Freq: Once | INTRAVENOUS | Status: AC
  Administered 2018-08-29: 500 mL via INTRAVENOUS

## 2018-08-28 MED ORDER — BUTORPHANOL TARTRATE 2 MG/ML IJ SOLN
1.0000 mg | INTRAMUSCULAR | Status: DC | PRN
  Administered 2018-08-29 (×2): 1 mg via INTRAVENOUS
  Filled 2018-08-28 (×2): qty 1

## 2018-08-28 MED ORDER — LIDOCAINE HCL (PF) 1 % IJ SOLN: 30.0000 mL | INTRAMUSCULAR | Status: DC | PRN

## 2018-08-28 MED ORDER — MISOPROSTOL 50MCG HALF TABLET
50.0000 ug | ORAL_TABLET | ORAL | Status: DC
  Administered 2018-08-28: 23:00:00 50 ug via VAGINAL

## 2018-08-28 MED ORDER — MISOPROSTOL 50MCG HALF TABLET
50.0000 ug | ORAL_TABLET | ORAL | Status: DC
  Administered 2018-08-28: 18:00:00 50 ug via ORAL
  Filled 2018-08-28: qty 1

## 2018-08-28 MED ORDER — ONDANSETRON HCL 4 MG/2ML IJ SOLN: 4.0000 mg | Freq: Four times a day (QID) | INTRAMUSCULAR | Status: DC | PRN

## 2018-08-28 NOTE — Progress Notes (Signed)
Patient ID: Tasha Macias, female   DOB: January 26, 1986, 33 y.o.   MRN: [id number]  Tasha Macias is a 33 y.o. [id number] at [redacted]w[redacted]d by ultrasound admitted for induction of labor due to Non-reactive NST and Postdates  Subjective:  Patient resting quietly in bed, reports occasional contractions. Positive fetal movement. FOB at bedside for continuous labor support.   Denies difficulty breathing or respiratory distress, chest pain, abdominal pain, vaginal bleeding, dysuria, and leg pain or swelling.   Objective:  Temp:  [98.6 F (37 C)-98.7 F (37.1 C)] 98.7 F (37.1 C) (07/20 2248) Pulse Rate:  [76-111] 76 (07/20 2248) Resp:  [17-18] 17 (07/20 1930) BP: (114-137)/(71-85) 137/75 (07/20 2248) Weight:  [99.8 kg] 99.8 kg (07/20 1510)  Fetal Wellbeing:  Category I  UC:   regular, every three (3) to four (4) minutes; soft resting tone  SVE:   Dilation: 1.5 Effacement (%): Thick Exam by:: Shanta Dorvil,CNM  Labs: Lab Results  Component Value Date   WBC 7.2 08/28/2018   HGB 10.7 (L) 08/28/2018   HCT 33.1 (L) 08/28/2018   MCV 93.2 08/28/2018   PLT 180 08/28/2018    Assessment:  Tasha Macias is a 33 y.o. A4T3646 at [redacted]w[redacted]d being admitted for induction of labor for post dates, non reactive NST in office, Rh positive, GBS negative  FHR Category I  Plan:  Foley bulb placed, cervix still posterior.   Second dose of cytotec of placed without difficulty.   Reviewed red flag symptoms and when to call.   Continue orders as written. Reassess as needed.    Diona Fanti, CNM [facility], [facility] 08/28/2018, 10:53 PM

## 2018-08-28 NOTE — H&P (Signed)
Obstetric History and Physical  Tasha Macias is a 33 y.o. [phone] with IUP at 6432w6d presenting for induction of labor due to postdates pregnancy with non-reactive NST in office earlier today.   Patient states she has been having  none contractions, none vaginal bleeding, intact membranes, with active fetal movement.    Denies difficulty breathing or respiratory distress, chest pain, abdominal pain, dysuria, and leg pain or swelling.   Prenatal Course  Source of Care: [facility]-initial visit at 12 wks, total visits: 11  Pregnancy complications or risks: former smoker, history of pre-eclampsia, mild right fetal renal pyelectasis  Prenatal labs and studies:  ABO, Rh: --/--/A POS (07/20 1547)  Antibody: NEG (07/20 1547)  Rubella: 4.43 (01/06 1544)  Varicella: 869 (01/06 1544)  RPR: Non Reactive (04/03 1113)   HBsAg: Negative (01/06 1544)   HIV: Non Reactive (01/06 1544)   JYN:WGNFAOZHGBS:Negative (06/16 1439)  1 hr Glucola : 86 (04/03 1113)  Genetic screening: low risk female (01/06 1544)   Anatomy US: Complete, normal except mild right fetal renal pyelectasis  Past Medical History:  Diagnosis Date  . Preeclampsia 2009    Past Surgical History:  Procedure Laterality Date  . DILATION AND CURETTAGE OF UTERUS      OB History  Gravida Para Term Preterm AB Living  5 3 3  0 1 3  SAB TAB Ectopic Multiple Live Births  1 0 0 0 3    # Outcome Date GA Lbr Len/2nd Weight Sex Delivery Anes PTL Lv  5 Current           4 Term 05/26/11    F Vag-Spont   LIV  3 Term 03/08/10    M Vag-Spont   LIV  2 Term 08/17/07    M Vag-Spont   LIV  1 SAB 2008            Social History   Socioeconomic History  . Marital status: Single    Spouse name: Not on file  . Number of children: Not on file  . Years of education: Not on file  . Highest education level: Not on file  Occupational History  . Not on file  Social Needs  . Financial resource strain: Not on file  . Food insecurity    Worry: Not on  file    Inability: Not on file  . Transportation needs    Medical: Not on file    Non-medical: Not on file  Tobacco Use  . Smoking status: Former Smoker    Packs/day: 0.50    Types: Cigarettes  . Smokeless tobacco: Never Used  Substance and Sexual Activity  . Alcohol use: Not Currently  . Drug use: Not Currently    Types: Marijuana  . Sexual activity: Not Currently    Birth control/protection: Other-see comments    Comment: still deciding  Lifestyle  . Physical activity    Days per week: Not on file    Minutes per session: Not on file  . Stress: Not on file  Relationships  . Social Musicianconnections    Talks on phone: Not on file    Gets together: Not on file    Attends religious service: Not on file    Active member of club or organization: Not on file    Attends meetings of clubs or organizations: Not on file    Relationship status: Not on file  Other Topics Concern  . Not on file  Social History Narrative  . Not on file  Family History  Problem Relation Age of Onset  . Breast cancer Neg Hx   . Ovarian cancer Neg Hx   . Colon cancer Neg Hx     Medications Prior to Admission  Medication Sig Dispense Refill Last Dose  . Prenatal Vit-Fe Fumarate-FA (PRENATAL MULTIVITAMIN) TABS tablet Take 1 tablet by mouth daily at 12 noon.   Past Month at Unknown time    No Known Allergies  Review of Systems: Negative except for what is mentioned in HPI.  Physical Exam:  Temp:  [98.6 F (37 C)] 98.6 F (37 C) (07/20 1510) Pulse Rate:  [111] 111 (07/20 1510) Resp:  [18] 18 (07/20 1510) BP: (114)/(85) 114/85 (07/20 1510) Weight:  [99.8 kg] 99.8 kg (07/20 1510)  GENERAL: Well-developed, well-nourished female in no acute distress.   LUNGS: Clear to auscultation bilaterally.   HEART: Regular rate and rhythm.  ABDOMEN: Soft, nontender, nondistended, gravid.  EXTREMITIES: Nontender, no edema, 2+ distal pulses.  Cervical Exam: Dilation: 1 Effacement (%): Thick Cervical  Position: Posterior Exam by:: Laurell Roof, RN  Fetal wellbeing: Category I/II  Contractions: Occasional, soft resting tone   Pertinent Labs/Studies:    Results for orders placed or performed during the hospital encounter of 08/28/18 (from the past 24 hour(s))  CBC     Status: Abnormal   Collection Time: 08/28/18  3:47 PM  Result Value Ref Range   WBC 7.2 4.0 - 10.5 K/uL   RBC 3.55 (L) 3.87 - 5.11 MIL/uL   Hemoglobin 10.7 (L) 12.0 - 15.0 g/dL   HCT 33.1 (L) 36.0 - 46.0 %   MCV 93.2 80.0 - 100.0 fL   MCH 30.1 26.0 - 34.0 pg   MCHC 32.3 30.0 - 36.0 g/dL   RDW 13.5 11.5 - 15.5 %   Platelets 180 150 - 400 K/uL   nRBC 0.0 0.0 - 0.2 %  Type and screen     Status: None   Collection Time: 08/28/18  3:47 PM  Result Value Ref Range   ABO/RH(D) A POS    Antibody Screen NEG    Sample Expiration      08/31/2018,2359 Performed at [facility], [address] [place], [place] [address]   SARS Coronavirus 2 (CEPHEID - Performed in [facility] hospital lab), Hosp Order     Status: None   Collection Time: 08/28/18  3:59 PM   Specimen: Nasopharyngeal Swab  Result Value Ref Range   SARS Coronavirus 2 NEGATIVE NEGATIVE    Assessment :  Tasha Macias is a 33 y.o. [id number] at [redacted]w[redacted]d being admitted for induction of labor for post dates, non reactive NST in office, Rh positive, GBS negative  FHR Category I/II  Plan:  Admit to birthing suites, see orders  Induction/Augmentation as needed, per protocol  Delivery plan: Hopeful for vaginal delivery  Dr. Marcelline Mates notified of admission and plan of care   Diona Fanti, CNM [facility], [facility] 08/28/18 5:50 PM

## 2018-08-28 NOTE — Patient Instructions (Signed)

## 2018-08-28 NOTE — Progress Notes (Signed)
NST non reactive   Baseline 130 , no qualifying accelerations, occasional variable , moderate variability. Irritability no toco. Pt sent to L&D for induction.   Philip Aspen, CNM

## 2018-08-29 ENCOUNTER — Inpatient Hospital Stay: Payer: Medicaid Other | Admitting: Registered Nurse

## 2018-08-29 DIAGNOSIS — Z3A40 40 weeks gestation of pregnancy: Secondary | ICD-10-CM

## 2018-08-29 DIAGNOSIS — O480 Post-term pregnancy: Principal | ICD-10-CM

## 2018-08-29 LAB — RPR: RPR Ser Ql: NONREACTIVE

## 2018-08-29 MED ORDER — WITCH HAZEL-GLYCERIN EX PADS: 1.0000 "application " | MEDICATED_PAD | CUTANEOUS | Status: DC | PRN

## 2018-08-29 MED ORDER — LIDOCAINE-EPINEPHRINE (PF) 1.5 %-1:200000 IJ SOLN
INTRAMUSCULAR | Status: DC | PRN
  Administered 2018-08-29: 3 mL via EPIDURAL

## 2018-08-29 MED ORDER — SODIUM CHLORIDE 0.9% FLUSH: 3.0000 mL | Freq: Two times a day (BID) | INTRAVENOUS | Status: DC

## 2018-08-29 MED ORDER — EPHEDRINE 5 MG/ML INJ
5.0000 mg | INTRAVENOUS | Status: DC | PRN
  Administered 2018-08-29: 13:00:00 5 mg via INTRAVENOUS

## 2018-08-29 MED ORDER — DIBUCAINE (PERIANAL) 1 % EX OINT: 1.0000 "application " | TOPICAL_OINTMENT | CUTANEOUS | Status: DC | PRN

## 2018-08-29 MED ORDER — FERROUS SULFATE 325 (65 FE) MG PO TABS
325.0000 mg | ORAL_TABLET | Freq: Every day | ORAL | Status: DC
  Administered 2018-08-30 – 2018-08-31 (×2): 325 mg via ORAL
  Filled 2018-08-29 (×2): qty 1

## 2018-08-29 MED ORDER — LIDOCAINE HCL (PF) 1 % IJ SOLN
INTRAMUSCULAR | Status: DC | PRN
  Administered 2018-08-29: 3 mL via SUBCUTANEOUS

## 2018-08-29 MED ORDER — IBUPROFEN 600 MG PO TABS
600.0000 mg | ORAL_TABLET | Freq: Four times a day (QID) | ORAL | Status: DC
  Administered 2018-08-29 – 2018-08-31 (×6): 600 mg via ORAL
  Filled 2018-08-29 (×7): qty 1

## 2018-08-29 MED ORDER — OXYTOCIN 10 UNIT/ML IJ SOLN
INTRAMUSCULAR | Status: AC
  Filled 2018-08-29: qty 2

## 2018-08-29 MED ORDER — ONDANSETRON HCL 4 MG/2ML IJ SOLN: 4.0000 mg | INTRAMUSCULAR | Status: DC | PRN

## 2018-08-29 MED ORDER — OXYTOCIN 40 UNITS IN NORMAL SALINE INFUSION - SIMPLE MED: 1.0000 m[IU]/min | INTRAVENOUS | Status: DC

## 2018-08-29 MED ORDER — COCONUT OIL OIL
1.0000 "application " | TOPICAL_OIL | Status: DC | PRN
  Administered 2018-08-31: 1 via TOPICAL
  Filled 2018-08-29: qty 120

## 2018-08-29 MED ORDER — OXYCODONE HCL 5 MG PO TABS: 10.0000 mg | ORAL_TABLET | ORAL | Status: DC | PRN

## 2018-08-29 MED ORDER — ACETAMINOPHEN 325 MG PO TABS
650.0000 mg | ORAL_TABLET | ORAL | Status: DC | PRN
  Administered 2018-08-30 (×4): 650 mg via ORAL
  Filled 2018-08-29 (×4): qty 2

## 2018-08-29 MED ORDER — AMMONIA AROMATIC IN INHA
RESPIRATORY_TRACT | Status: AC
  Filled 2018-08-29: qty 10

## 2018-08-29 MED ORDER — FENTANYL 2.5 MCG/ML W/ROPIVACAINE 0.15% IN NS 100 ML EPIDURAL (ARMC)
EPIDURAL | Status: DC | PRN
  Administered 2018-08-29: 12 mL/h via EPIDURAL

## 2018-08-29 MED ORDER — EPHEDRINE 5 MG/ML INJ
INTRAVENOUS | Status: AC
  Filled 2018-08-29: qty 4

## 2018-08-29 MED ORDER — LIDOCAINE HCL (PF) 1 % IJ SOLN
INTRAMUSCULAR | Status: AC
  Filled 2018-08-29: qty 30

## 2018-08-29 MED ORDER — DIPHENHYDRAMINE HCL 25 MG PO CAPS: 25.0000 mg | ORAL_CAPSULE | Freq: Four times a day (QID) | ORAL | Status: DC | PRN

## 2018-08-29 MED ORDER — FENTANYL 2.5 MCG/ML W/ROPIVACAINE 0.15% IN NS 100 ML EPIDURAL (ARMC)
EPIDURAL | Status: AC
  Filled 2018-08-29: qty 100

## 2018-08-29 MED ORDER — SENNOSIDES-DOCUSATE SODIUM 8.6-50 MG PO TABS
2.0000 | ORAL_TABLET | ORAL | Status: DC
  Administered 2018-08-30: 09:00:00 2 via ORAL
  Filled 2018-08-29 (×2): qty 2

## 2018-08-29 MED ORDER — SODIUM CHLORIDE 0.9% FLUSH: 3.0000 mL | INTRAVENOUS | Status: DC | PRN

## 2018-08-29 MED ORDER — BENZOCAINE-MENTHOL 20-0.5 % EX AERO: 1.0000 "application " | INHALATION_SPRAY | CUTANEOUS | Status: DC | PRN

## 2018-08-29 MED ORDER — ONDANSETRON HCL 4 MG PO TABS: 4.0000 mg | ORAL_TABLET | ORAL | Status: DC | PRN

## 2018-08-29 MED ORDER — SIMETHICONE 80 MG PO CHEW: 80.0000 mg | CHEWABLE_TABLET | ORAL | Status: DC | PRN

## 2018-08-29 MED ORDER — SODIUM CHLORIDE 0.9 % IV SOLN: 250.0000 mL | INTRAVENOUS | Status: DC | PRN

## 2018-08-29 MED ORDER — MISOPROSTOL 200 MCG PO TABS
ORAL_TABLET | ORAL | Status: AC
  Filled 2018-08-29: qty 4

## 2018-08-29 MED ORDER — BUPIVACAINE HCL (PF) 0.25 % IJ SOLN
INTRAMUSCULAR | Status: DC | PRN
  Administered 2018-08-29 (×2): 4 mL via EPIDURAL

## 2018-08-29 MED ORDER — OXYCODONE HCL 5 MG PO TABS: 5.0000 mg | ORAL_TABLET | ORAL | Status: DC | PRN

## 2018-08-29 MED ORDER — PRENATAL MULTIVITAMIN CH
1.0000 | ORAL_TABLET | Freq: Every day | ORAL | Status: DC
  Administered 2018-08-30: 12:00:00 1 via ORAL
  Filled 2018-08-29: qty 1

## 2018-08-29 NOTE — Plan of Care (Signed)
Pt admitted to L&D for a nonreactive NST and post dates. Pt started on misoprostol for IOL and augmented by AROM in AM. Pt progressed well and pain controlled throughout labor with IV pain medication, Stadol, and epidural. Pt had successful vaginal delivery. Pt was informed about plan of care and all questions were answered regarding her care. Pt recovered in L&D and transferred to M/B for couplet care.

## 2018-08-29 NOTE — Lactation Note (Signed)
This note was copied from a baby's chart. Lactation Consultation Note  Patient Name: Tasha Macias Today's Date: 08/29/2018 Reason for consult: Initial assessment;1st time breastfeeding   Maternal Data Formula Feeding for Exclusion: No Has patient been taught Hand Expression?: Yes Does the patient have breastfeeding experience prior to this delivery?: Yes  Feeding Feeding Type: Breast Fed  LATCH Score Latch: Repeated attempts needed to sustain latch, nipple held in mouth throughout feeding, stimulation needed to elicit sucking reflex.  Audible Swallowing: A few with stimulation  Type of Nipple: Everted at rest and after stimulation  Comfort (Breast/Nipple): Soft / non-tender  Hold (Positioning): Assistance needed to correctly position infant at breast and maintain latch.  LATCH Score: 7  Interventions Interventions: Breast feeding basics reviewed;Assisted with latch;Skin to skin;Hand express  Lactation Tools Discussed/Used     Consult Status Consult Status: Follow-up Date: 08/30/18 Follow-up type: In-patient MOB has breastfeed 3xs prior to this baby and was able to breastfeed last baby who is now 34yo exclusively for 61mos. MOB denies any issues with nursing in the past.  Baby was able to sustain latch by having LC hold mom's breast tissue. Once baby was coordinated, she breastfed independently.  LC reviewed: rooming-in, skin-to-skin, feeding cues, clusterfeeding, and will f/u with dyad tomorrow.   Marnee Spring 08/29/2018, 4:55 PM

## 2018-08-29 NOTE — Anesthesia Procedure Notes (Signed)
Epidural Patient location during procedure: OB Start time: 08/29/2018 11:43 AM End time: 08/29/2018 11:50 AM  Staffing Anesthesiologist: Emmie Niemann, MD Resident/CRNA: Hedda Slade, CRNA Performed: resident/CRNA   Preanesthetic Checklist Completed: patient identified, site marked, surgical consent, pre-op evaluation, timeout performed, IV checked, risks and benefits discussed and monitors and equipment checked  Epidural Patient position: sitting Prep: ChloraPrep Patient monitoring: heart rate, continuous pulse ox and blood pressure Approach: midline Location: L3-L4 Injection technique: LOR air  Needle:  Needle type: Tuohy  Needle gauge: 17 G Needle length: 9 cm and 9 Needle insertion depth: 7 cm Catheter type: closed end flexible Catheter size: 19 Gauge Catheter at skin depth: 12 cm Test dose: negative and 1.5% lidocaine with Epi 1:200 K  Assessment Events: blood not aspirated, injection not painful, no injection resistance, negative IV test and no paresthesia  Additional Notes 1 attempt Pt. Evaluated and documentation done after procedure finished. Patient identified. Risks/Benefits/Options discussed with patient including but not limited to bleeding, infection, nerve damage, paralysis, failed block, incomplete pain control, headache, blood pressure changes, nausea, vomiting, reactions to medication both or allergic, itching and postpartum back pain. Confirmed with bedside nurse the patient's most recent platelet count. Confirmed with patient that they are not currently taking any anticoagulation, have any bleeding history or any family history of bleeding disorders. Patient expressed understanding and wished to proceed. All questions were answered. Sterile technique was used throughout the entire procedure. Please see nursing notes for vital signs. Test dose was given through epidural catheter and negative prior to continuing to dose epidural or start infusion. Warning signs  of high block given to the patient including shortness of breath, tingling/numbness in hands, complete motor block, or any concerning symptoms with instructions to call for help. Patient was given instructions on fall risk and not to get out of bed. All questions and concerns addressed with instructions to call with any issues or inadequate analgesia.   Patient tolerated the insertion well without immediate complications.Reason for block:procedure for pain

## 2018-08-29 NOTE — Progress Notes (Signed)
Patient ID: Lidwina Kaner [place], female   DOB: Sep 01, 1985, 33 y.o.   MRN: [id number]  Two Strike is a 33 y.o. [id number] at [redacted]w[redacted]d by ultrasound admitted for induction of labor due to Post dates. Due date 08/22/2018 and non-reactive NST in office.  Subjective:  Patient reports "back to back" contractions. Rates pain: 9/10, declines pharmacologic pain management options.   Denies difficulty breathing or respiratory distress, chest pain, vaginal bleeding, leakage of fluid, and leg pain or swelling.   Objective:  Temp:  [98.4 F (36.9 C)-98.7 F (37.1 C)] 98.4 F (36.9 C) (07/21 0715) Pulse Rate:  [76-111] 84 (07/21 0715) Resp:  [16-18] 16 (07/21 0715) BP: (114-137)/(65-85) 117/65 (07/21 0715) Weight:  [99.8 kg] 99.8 kg (07/20 1510)  Fetal Wellbeing:  Category I  UC:   regular, every two (2) to seven (7) minutes; soft resting tone  SVE:   Dilation: 3 Effacement (%): 50, 60 Station: -2 Exam by:: [employer]: Lab Results  Component Value Date   WBC 7.2 08/28/2018   HGB 10.7 (L) 08/28/2018   HCT 33.1 (L) 08/28/2018   MCV 93.2 08/28/2018   PLT 180 08/28/2018    Assessment:  Cherica D Streetis a 89 y.E.G3T5176 at [redacted]w[redacted]d being admitted for induction of labor for post dates, non reactive NST in office, Rh positive, GBS negative  FHR Category I  Plan:  Discussed options for AROM or pitocin augmentation at this time. Decision to wait on further intervention at this time.   Encouraged position change and use of peanut ball.   Reviewed red flag symptoms and when to call.   Continue orders as written. Reassess as needed.    Diona Fanti, CNM [facility], [facility] 08/29/2018, 7:30 AM

## 2018-08-29 NOTE — Anesthesia Preprocedure Evaluation (Signed)
Anesthesia Evaluation  Patient identified by MRN, date of birth, ID band Patient awake    Reviewed: Allergy & Precautions, H&P , NPO status , Patient's Chart, lab work & pertinent test results  Airway Mallampati: III  TM Distance: >3 FB Neck ROM: full    Dental no notable dental hx. (+) Teeth Intact   Pulmonary former smoker,    Pulmonary exam normal        Cardiovascular hypertension (preeclampsia with prior pregnancy), Normal cardiovascular exam     Neuro/Psych negative neurological ROS  negative psych ROS   GI/Hepatic negative GI ROS, Neg liver ROS,   Endo/Other  negative endocrine ROS  Renal/GU negative Renal ROS  negative genitourinary   Musculoskeletal   Abdominal   Peds  Hematology negative hematology ROS (+)   Anesthesia Other Findings   Reproductive/Obstetrics (+) Pregnancy                             Anesthesia Physical Anesthesia Plan  ASA: II  Anesthesia Plan: Epidural   Post-op Pain Management:    Induction:   PONV Risk Score and Plan:   Airway Management Planned:   Additional Equipment:   Intra-op Plan:   Post-operative Plan:   Informed Consent: I have reviewed the patients History and Physical, chart, labs and discussed the procedure including the risks, benefits and alternatives for the proposed anesthesia with the patient or authorized representative who has indicated his/her understanding and acceptance.       Plan Discussed with: Anesthesiologist and CRNA  Anesthesia Plan Comments:         Anesthesia Quick Evaluation

## 2018-08-30 LAB — CBC
HCT: 34 % — ABNORMAL LOW (ref 36.0–46.0)
Hemoglobin: 10.8 g/dL — ABNORMAL LOW (ref 12.0–15.0)
MCH: 30.4 pg (ref 26.0–34.0)
MCHC: 31.8 g/dL (ref 30.0–36.0)
MCV: 95.8 fL (ref 80.0–100.0)
Platelets: 177 10*3/uL (ref 150–400)
RBC: 3.55 MIL/uL — ABNORMAL LOW (ref 3.87–5.11)
RDW: 13.7 % (ref 11.5–15.5)
WBC: 10.9 10*3/uL — ABNORMAL HIGH (ref 4.0–10.5)
nRBC: 0 % (ref 0.0–0.2)

## 2018-08-30 MED ORDER — DOCUSATE SODIUM 100 MG PO CAPS: 100.0000 mg | ORAL_CAPSULE | Freq: Two times a day (BID) | ORAL | 2 refills | Status: AC

## 2018-08-30 MED ORDER — IBUPROFEN 600 MG PO TABS: 600.0000 mg | ORAL_TABLET | Freq: Four times a day (QID) | ORAL | 0 refills | Status: DC

## 2018-08-30 MED ORDER — FERROUS SULFATE 325 (65 FE) MG PO TABS: 325.0000 mg | ORAL_TABLET | Freq: Every day | ORAL | 3 refills | Status: DC

## 2018-08-30 NOTE — Discharge Summary (Signed)
@  ULAGTXM@                            Discharge Summary  Date of Admission: 08/28/2018  Date of Discharge: 08/30/2018  Admitting Diagnosis: Induction of labor at [redacted]w[redacted]d  Mode of Delivery: normal spontaneous vaginal delivery                 Discharge Diagnosis: No other diagnosis   Intrapartum Procedures: epidural   Post partum procedures: none  Complications: none                      Discharge Day SOAP Note:  Progress Note - Vaginal Delivery  Tasha Macias is a 33 y.o. I6O0321 now PP day 1 s/p Vaginal, Spontaneous . Delivery was uncomplicated  Subjective  The patient has the following complaints: has no unusual complaints  Pain is controlled with current medications.   Patient is urinating without difficulty.  She is ambulating well.    Objective  Vital signs: BP 115/75 (BP Location: Right Arm)   Pulse 75   Temp 98.2 F (36.8 C) (Oral)   Resp 18   Ht 5\' 4"  (1.626 m)   Wt 99.8 kg   LMP 11/16/2017 (Exact Date) Comment: neg preg  SpO2 99%   Breastfeeding Unknown   BMI 37.76 kg/m   Physical Exam: Gen: NAD Fundus Fundal Tone: (P) Firm  Lochia Amount: (P) Small  Perineum Appearance: Intact     Data Review Labs: CBC Latest Ref Rng & Units 08/30/2018 08/28/2018 05/12/2018  WBC 4.0 - 10.5 K/uL 10.9(H) 7.2 6.4  Hemoglobin 12.0 - 15.0 g/dL 10.8(L) 10.7(L) 11.4  Hematocrit 36.0 - 46.0 % 34.0(L) 33.1(L) 33.9(L)  Platelets 150 - 400 K/uL 177 180 191   A POS  Assessment/Plan  Active Problems:   Labor and delivery, indication for care   History of pre-eclampsia    Plan for discharge today.   Discharge Instructions: Per After Visit Summary. Activity: Advance as tolerated. Pelvic rest for 6 weeks.  Also refer to After Visit Summary Diet: Regular Medications: Allergies as of 08/30/2018   No Known Allergies     Medication List    TAKE these medications   docusate sodium 100 MG capsule Commonly known as: Colace Take 1 capsule (100 mg total) by mouth 2 (two)  times daily.   ferrous sulfate 325 (65 FE) MG tablet Take 1 tablet (325 mg total) by mouth daily with breakfast.   ibuprofen 600 MG tablet Commonly known as: ADVIL Take 1 tablet (600 mg total) by mouth every 6 (six) hours.   prenatal multivitamin Tabs tablet Take 1 tablet by mouth daily at 12 noon.      Outpatient follow up: Diego Cory CNM 6 wks check up  Postpartum contraception: Plans Mirena IUD   Discharged Condition: good  Discharged to: home  Newborn Data: Disposition:home with mother  Apgars: APGAR (1 MIN): 8   APGAR (5 MINS): 9   APGAR (10 MINS):    Baby Feeding: Breast    Philip Aspen, CNM  08/30/2018 8:45 AM

## 2018-08-30 NOTE — Final Progress Note (Signed)
Discharge Day SOAP Note:  Progress Note - Vaginal Delivery  Tasha Macias is a 33 y.o. H4V4259 now PP day 1 s/p Vaginal, Spontaneous . Delivery was uncomplicated  Subjective  The patient has the following complaints: has no unusual complaints  Pain is controlled with current medications.   Patient is urinating without difficulty.  She is ambulating well.    Objective  Vital signs: BP 115/75 (BP Location: Right Arm)   Pulse 75   Temp 98.2 F (36.8 C) (Oral)   Resp 18   Ht 5\' 4"  (1.626 m)   Wt 99.8 kg   LMP 11/16/2017 (Exact Date) Comment: neg preg  SpO2 99%   Breastfeeding Unknown   BMI 37.76 kg/m   Physical Exam: Gen: NAD Fundus Fundal Tone: (P) Firm  Lochia Amount: (P) Small  Perineum Appearance: Intact     Data Review Labs: CBC Latest Ref Rng & Units 08/30/2018 08/28/2018 05/12/2018  WBC 4.0 - 10.5 K/uL 10.9(H) 7.2 6.4  Hemoglobin 12.0 - 15.0 g/dL 10.8(L) 10.7(L) 11.4  Hematocrit 36.0 - 46.0 % 34.0(L) 33.1(L) 33.9(L)  Platelets 150 - 400 K/uL 177 180 191   A POS  Assessment/Plan  Active Problems:   Labor and delivery, indication for care   History of pre-eclampsia    Plan for discharge today.   Discharge Instructions: Per After Visit Summary. Activity: Advance as tolerated. Pelvic rest for 6 weeks.  Also refer to After Visit Summary Diet: Regular Medications: Allergies as of 08/30/2018   No Known Allergies     Medication List    TAKE these medications   docusate sodium 100 MG capsule Commonly known as: Colace Take 1 capsule (100 mg total) by mouth 2 (two) times daily.   ferrous sulfate 325 (65 FE) MG tablet Take 1 tablet (325 mg total) by mouth daily with breakfast.   ibuprofen 600 MG tablet Commonly known as: ADVIL Take 1 tablet (600 mg total) by mouth every 6 (six) hours.   prenatal multivitamin Tabs tablet Take 1 tablet by mouth daily at 12 noon.      Outpatient follow up: Diego Cory CNM 6 wks check up  Postpartum contraception:  Plans Mirena IUD   Discharged Condition: good  Discharged to: home  Newborn Data: Disposition:home with mother  Apgars: APGAR (1 MIN): 8   APGAR (5 MINS): 9   APGAR (10 MINS):    Baby Feeding: Breast    Philip Aspen, CNM  08/30/2018 8:45 AM

## 2018-08-30 NOTE — Lactation Note (Signed)
This note was copied from a baby's chart. Lactation Consultation Note  Patient Name: [name] Today's Date: 08/30/2018   LC went in to check on mom after 12pm feed.  Mom was laying flat, supine, in hospital bed with infant on chest, no chest. Mom was asleep.  LC softly woke mom to move baby to bassinet. While swaddling baby, LC noted early cuing from baby; attempted to wake mom to feed, mom declined. Infant was swaddled seemed to calm down, content.  Homer let her nurse know how she found mom/baby and steps taken to put baby back in bassinet.  Maternal Data    Feeding Feeding Type: Breast Fed  [facility] Score                   Interventions    Lactation Tools Discussed/Used     Consult Status      Tasha Macias 08/30/2018, 1:09 PM

## 2018-08-30 NOTE — Anesthesia Postprocedure Evaluation (Signed)
Anesthesia Post Note  Patient: Tasha Macias  Procedure(s) Performed: AN AD HOC LABOR EPIDURAL  Patient location during evaluation: Mother Baby Anesthesia Type: Epidural Level of consciousness: awake and alert Pain management: pain level controlled Vital Signs Assessment: post-procedure vital signs reviewed and stable Respiratory status: spontaneous breathing, nonlabored ventilation and respiratory function stable Cardiovascular status: stable Postop Assessment: no headache, no backache and epidural receding Anesthetic complications: no     Last Vitals:  Vitals:   08/30/18 0000 08/30/18 0430  BP: 110/64 116/71  Pulse: 84 84  Resp: 18 18  Temp: 37.1 C 36.7 C  SpO2:      Last Pain:  Vitals:   08/30/18 0430  TempSrc: Oral  PainSc:                  Blima Singer

## 2018-08-31 NOTE — Progress Notes (Signed)
Patient discharged home with infant. Discharge instructions and prescriptions given and reviewed with patient. Patient verbalized understanding. Will be escorted out by staff. 

## 2018-08-31 NOTE — Discharge Summary (Signed)
Discharge Summary  Date of Admission: 08/28/2018  Date of Discharge: 08/31/2018 Discharge  Admitting Diagnosis: Induction of labor at [redacted]w[redacted]d  Mode of Delivery: normal spontaneous vaginal delivery                                                  Discharge Diagnosis: No other diagnosis              Intrapartum Procedures: epidural              Post partum procedures: none  Complications: none                      Discharge Day SOAP Note:  Progress Note - Vaginal Delivery  Tasha Macias is a 33 y.o. F7T0240 now PP day 2 s/p Vaginal, Spontaneous . Delivery was uncomplicated. Discharged delayed due to baby feeding poorly.   Subjective  The patient has the following complaints: has no unusual complaints  Pain is controlled with current medications.   Patient is urinating without difficulty.  She is ambulating well.    Objective  Vital signs: BP 115/75 (BP Location: Right Arm)   Pulse 75   Temp 98.2 F (36.8 C) (Oral)   Resp 18   Ht 5\' 4"  (1.626 m)   Wt 99.8 kg   LMP 11/16/2017 (Exact Date) Comment: neg preg  SpO2 99%   Breastfeeding Unknown   BMI 37.76 kg/m   Physical Exam: Gen: NAD Fundus Fundal Tone: (P) Firm  Lochia Amount: (P) Small  Perineum Appearance: Intact                Data Review Labs: CBC Latest Ref Rng & Units 08/30/2018 08/28/2018 05/12/2018  WBC 4.0 - 10.5 K/uL 10.9(H) 7.2 6.4  Hemoglobin 12.0 - 15.0 g/dL 10.8(L) 10.7(L) 11.4  Hematocrit 36.0 - 46.0 % 34.0(L) 33.1(L) 33.9(L)  Platelets 150 - 400 K/uL 177 180 191   A POS  Assessment/Plan  Active Problems:   Labor and delivery, indication for care   History of pre-eclampsia    Plan for discharge today.   Discharge Instructions: Per After Visit Summary. Activity: Advance as tolerated. Pelvic rest for 6 weeks.  Also refer to After Visit Summary Diet: Regular Medications: Allergies as of 08/31/2018   No Known Allergies        Medication List    TAKE  these medications   docusate sodium 100 MG capsule Commonly known as: Colace Take 1 capsule (100 mg total) by mouth 2 (two) times daily.   ferrous sulfate 325 (65 FE) MG tablet Take 1 tablet (325 mg total) by mouth daily with breakfast.   ibuprofen 600 MG tablet Commonly known as: ADVIL Take 1 tablet (600 mg total) by mouth every 6 (six) hours.   prenatal multivitamin Tabs tablet Take 1 tablet by mouth daily at 12 noon.      Outpatient follow up: Diego Cory CNM 6 wks check up  Postpartum contraception: Plans Mirena IUD   Discharged Condition: good  Discharged to: home  Newborn Data: Disposition:home with mother  Apgars: APGAR (1 MIN): 8   APGAR (5 MINS): 9   APGAR (10 MINS):    Baby Feeding: Breast   Philip Aspen, CNM

## 2018-08-31 NOTE — Final Progress Note (Signed)
Discharge Day SOAP Note:  Progress Note - Vaginal Delivery  Tasha Macias is a 33 y.o. P5T6144 now PP day 2 s/p Vaginal, Spontaneous . Delivery was uncomplicated. Discharged delayed due to baby feeding poorly.   Subjective  The patient has the following complaints: has no unusual complaints  Pain is controlled with current medications.   Patient is urinating without difficulty.  She is ambulating well.    Objective  Vital signs: BP 115/75 (BP Location: Right Arm)   Pulse 75   Temp 98.2 F (36.8 C) (Oral)   Resp 18   Ht 5\' 4"  (1.626 m)   Wt 99.8 kg   LMP 11/16/2017 (Exact Date) Comment: neg preg  SpO2 99%   Breastfeeding Unknown   BMI 37.76 kg/m   Physical Exam: Gen: NAD Fundus Fundal Tone: (P) Firm  Lochia Amount: (P) Small  Perineum Appearance: Intact                Data Review Labs: CBC Latest Ref Rng & Units 08/30/2018 08/28/2018 05/12/2018  WBC 4.0 - 10.5 K/uL 10.9(H) 7.2 6.4  Hemoglobin 12.0 - 15.0 g/dL 10.8(L) 10.7(L) 11.4  Hematocrit 36.0 - 46.0 % 34.0(L) 33.1(L) 33.9(L)  Platelets 150 - 400 K/uL 177 180 191   A POS  Assessment/Plan  Active Problems:   Labor and delivery, indication for care   History of pre-eclampsia    Plan for discharge today.   Discharge Instructions: Per After Visit Summary. Activity: Advance as tolerated. Pelvic rest for 6 weeks.  Also refer to After Visit Summary Diet: Regular Medications: Allergies as of 08/31/2018   No Known Allergies        Medication List    TAKE these medications   docusate sodium 100 MG capsule Commonly known as: Colace Take 1 capsule (100 mg total) by mouth 2 (two) times daily.   ferrous sulfate 325 (65 FE) MG tablet Take 1 tablet (325 mg total) by mouth daily with breakfast.   ibuprofen 600 MG tablet Commonly known as: ADVIL Take 1 tablet (600 mg total) by mouth every 6 (six) hours.   prenatal multivitamin Tabs tablet Take 1 tablet by mouth daily at  12 noon.      Outpatient follow up: Diego Cory CNM 6 wks check up  Postpartum contraception: Plans Mirena IUD   Discharged Condition: good  Discharged to: home  Newborn Data: Disposition:home with mother  Apgars: APGAR (1 MIN): 8   APGAR (5 MINS): 9   APGAR (10 MINS):    Baby Feeding: Breast   Philip Aspen, CNM

## 2019-02-27 ENCOUNTER — Ambulatory Visit: Payer: Self-pay

## 2019-09-27 ENCOUNTER — Ambulatory Visit: Payer: Medicaid Other

## 2019-12-12 ENCOUNTER — Other Ambulatory Visit: Payer: Self-pay

## 2019-12-12 ENCOUNTER — Ambulatory Visit (LOCAL_COMMUNITY_HEALTH_CENTER): Payer: Medicaid Other | Admitting: Advanced Practice Midwife

## 2019-12-12 VITALS — BP 132/81 | Ht 64.0 in | Wt 226.5 lb

## 2019-12-12 DIAGNOSIS — Z3201 Encounter for pregnancy test, result positive: Secondary | ICD-10-CM | POA: Diagnosis not present

## 2019-12-12 DIAGNOSIS — O99345 Other mental disorders complicating the puerperium: Secondary | ICD-10-CM | POA: Diagnosis not present

## 2019-12-12 DIAGNOSIS — F53 Postpartum depression: Secondary | ICD-10-CM | POA: Diagnosis not present

## 2019-12-12 DIAGNOSIS — Z8659 Personal history of other mental and behavioral disorders: Secondary | ICD-10-CM | POA: Insufficient documentation

## 2019-12-12 LAB — PREGNANCY, URINE: Preg Test, Ur: POSITIVE — AB

## 2019-12-12 MED ORDER — PRENATAL 27-0.8 MG PO TABS: 1.0000 | ORAL_TABLET | Freq: Every day | ORAL | 0 refills | Status: AC

## 2019-12-12 NOTE — Progress Notes (Signed)
UPT positive today. Unsure what her plans are for pregnancy. Pt openly crying and distressed when RN gave + preg results. Elevated PHQ (#11) today.  Local community provider Pharmacist, hospital along with Hazeline Junker, LCSW contact info and local mental health resource info given. Admits she wants to talk to provider today. Escorted to [facility] and E. Sciora, CNM to speak with pt. Jerel Shepherd, RN

## 2019-12-12 NOTE — Progress Notes (Signed)
       1. Pregnancy test-positive  - Pregnancy, urine - Prenatal Vit-Fe Fumarate-FA (MULTIVITAMIN-PRENATAL) 27-0.8 MG TABS tablet; Take 1 tablet by mouth daily at 12 noon.  Dispense: 100 tablet; Refill: 0  2. Postpartum depression     Please refer to After Visit Summary for other counseling recommendations.   Return if symptoms worsen or fail to improve.  Alberteen Spindle, CNM [facility]34 yo SBF G7P4 brought over from nurse clinic by Oris Drone, RN because pt with PhQ-9=11 and pt visibly upset by +PT with no birth control.  EAB 02/2019.  Nursing 71 mo old daughter.  Hx pp depression with no hospitalization or meds.  Pt denies SI/HI.  Doesn't want to be pregnant.  Info given by RN as well as Lexicographer for [employer], LCSW.  Pt talking on phone and made 4 phone calls while provider in room; pt appears calm and distracted but handling situation (no one to get her autistic son off the bus at home).  LMP 10/03/19. Offered services for [employer], which pt declines at this time

## 2020-01-23 ENCOUNTER — Encounter: Payer: Medicaid Other | Admitting: Certified Nurse Midwife

## 2020-01-23 ENCOUNTER — Ambulatory Visit (INDEPENDENT_AMBULATORY_CARE_PROVIDER_SITE_OTHER): Payer: Medicaid Other

## 2020-01-23 ENCOUNTER — Other Ambulatory Visit: Payer: Self-pay

## 2020-01-23 ENCOUNTER — Other Ambulatory Visit: Payer: Self-pay | Admitting: Certified Nurse Midwife

## 2020-01-23 ENCOUNTER — Other Ambulatory Visit: Payer: Medicaid Other

## 2020-01-23 DIAGNOSIS — Z789 Other specified health status: Secondary | ICD-10-CM | POA: Diagnosis not present

## 2020-01-23 DIAGNOSIS — Z3A16 16 weeks gestation of pregnancy: Secondary | ICD-10-CM

## 2020-01-24 LAB — URINALYSIS, ROUTINE W REFLEX MICROSCOPIC
Bilirubin, UA: NEGATIVE
Glucose, UA: NEGATIVE
Nitrite, UA: NEGATIVE
RBC, UA: NEGATIVE
Specific Gravity, UA: >=1.03 — AB (ref 1.005–1.030)
Urobilinogen, Ur: 1 mg/dL (ref 0.2–1.0)
pH, UA: 6.5 (ref 5.0–7.5)

## 2020-01-24 LAB — HGB SOLU + RFLX FRAC: Sickle Solubility Test - HGBRFX: NEGATIVE

## 2020-01-24 LAB — CBC WITH DIFFERENTIAL
Basophils Absolute: 0 10*3/uL (ref 0.0–0.2)
Basos: 0 %
EOS (ABSOLUTE): 0.2 10*3/uL (ref 0.0–0.4)
Eos: 5 %
Hematocrit: 36 % (ref 34.0–46.6)
Hemoglobin: 11.8 g/dL (ref 11.1–15.9)
Immature Grans (Abs): 0 10*3/uL (ref 0.0–0.1)
Immature Granulocytes: 0 %
Lymphocytes Absolute: 1.2 10*3/uL (ref 0.7–3.1)
Lymphs: 26 %
MCH: 30.3 pg (ref 26.6–33.0)
MCHC: 32.8 g/dL (ref 31.5–35.7)
MCV: 93 fL (ref 79–97)
Monocytes Absolute: 0.3 10*3/uL (ref 0.1–0.9)
Monocytes: 6 %
Neutrophils Absolute: 2.9 10*3/uL (ref 1.4–7.0)
Neutrophils: 63 %
RBC: 3.89 x10E6/uL (ref 3.77–5.28)
RDW: 13.2 % (ref 11.7–15.4)
WBC: 4.7 10*3/uL (ref 3.4–10.8)

## 2020-01-24 LAB — MICROSCOPIC EXAMINATION
Bacteria, UA: NONE SEEN
Casts: NONE SEEN /lpf

## 2020-01-24 LAB — HEPATITIS B SURFACE ANTIGEN: Hepatitis B Surface Ag: NEGATIVE

## 2020-01-24 LAB — RUBELLA SCREEN: Rubella Antibodies, IGG: 3.91 index (ref >0.99)

## 2020-01-24 LAB — HIV ANTIBODY (ROUTINE TESTING W REFLEX): HIV Screen 4th Generation wRfx: NONREACTIVE

## 2020-01-24 LAB — HEPATITIS C ANTIBODY: Hep C Virus Ab: <0.1 s/co ratio (ref 0.0–0.9)

## 2020-01-24 LAB — HEMOGLOBIN A1C
Est. average glucose Bld gHb Est-mCnc: 97 mg/dL
Hgb A1c MFr Bld: 5 % (ref 4.8–5.6)

## 2020-01-24 LAB — ABO AND RH: Rh Factor: POSITIVE

## 2020-01-24 LAB — VARICELLA ZOSTER ANTIBODY, IGG: Varicella zoster IgG: 653 index (ref >165)

## 2020-01-24 LAB — TSH: TSH: 1.02 u[IU]/mL (ref 0.450–4.500)

## 2020-01-24 LAB — ANTIBODY SCREEN: Antibody Screen: NEGATIVE

## 2020-01-24 LAB — RPR: RPR Ser Ql: NONREACTIVE

## 2020-01-25 LAB — URINE CULTURE: Organism ID, Bacteria: NO GROWTH

## 2020-01-26 LAB — MONITOR DRUG PROFILE 14(MW)
Amphetamine Scrn, Ur: NEGATIVE ng/mL
BARBITURATE SCREEN URINE: NEGATIVE ng/mL
BENZODIAZEPINE SCREEN, URINE: NEGATIVE ng/mL
Buprenorphine, Urine: NEGATIVE ng/mL
CANNABINOIDS UR QL SCN: NEGATIVE ng/mL
Cocaine (Metab) Scrn, Ur: NEGATIVE ng/mL
Creatinine(Crt), U: 312.6 mg/dL — ABNORMAL HIGH (ref 20.0–300.0)
Fentanyl, Urine: NEGATIVE pg/mL
Meperidine Screen, Urine: NEGATIVE ng/mL
Methadone Screen, Urine: NEGATIVE ng/mL
OXYCODONE+OXYMORPHONE UR QL SCN: NEGATIVE ng/mL
Opiate Scrn, Ur: NEGATIVE ng/mL
Ph of Urine: 6.8 (ref 4.5–8.9)
Phencyclidine Qn, Ur: NEGATIVE ng/mL
Propoxyphene Scrn, Ur: NEGATIVE ng/mL
SPECIFIC GRAVITY: 1.029
Tramadol Screen, Urine: NEGATIVE ng/mL

## 2020-01-26 LAB — GC/CHLAMYDIA PROBE AMP
Chlamydia trachomatis, NAA: NEGATIVE
Neisseria Gonorrhoeae by PCR: NEGATIVE

## 2020-01-28 ENCOUNTER — Other Ambulatory Visit (HOSPITAL_COMMUNITY)
Admission: RE | Admit: 2020-01-28 | Discharge: 2020-01-28 | Disposition: A | Discharge status: Home / Self Care | Payer: Medicaid Other | Source: Ambulatory Visit | Attending: Certified Nurse Midwife | Admitting: Certified Nurse Midwife

## 2020-01-28 ENCOUNTER — Telehealth: Payer: Self-pay

## 2020-01-28 ENCOUNTER — Encounter: Payer: Self-pay | Admitting: Certified Nurse Midwife

## 2020-01-28 ENCOUNTER — Other Ambulatory Visit: Payer: Self-pay

## 2020-01-28 ENCOUNTER — Ambulatory Visit (INDEPENDENT_AMBULATORY_CARE_PROVIDER_SITE_OTHER): Payer: Medicaid Other | Admitting: Certified Nurse Midwife

## 2020-01-28 VITALS — BP 106/67 | HR 82 | Wt 230.4 lb

## 2020-01-28 DIAGNOSIS — Z3A16 16 weeks gestation of pregnancy: Secondary | ICD-10-CM | POA: Diagnosis not present

## 2020-01-28 DIAGNOSIS — Z3482 Encounter for supervision of other normal pregnancy, second trimester: Secondary | ICD-10-CM

## 2020-01-28 DIAGNOSIS — Z124 Encounter for screening for malignant neoplasm of cervix: Secondary | ICD-10-CM | POA: Diagnosis not present

## 2020-01-28 LAB — POCT URINALYSIS DIPSTICK OB
Bilirubin, UA: NEGATIVE
Blood, UA: NEGATIVE
Glucose, UA: NEGATIVE
Ketones, UA: NEGATIVE
Leukocytes, UA: NEGATIVE
Nitrite, UA: NEGATIVE
POC,PROTEIN,UA: NEGATIVE
Spec Grav, UA: 1.02 (ref 1.010–1.025)
Urobilinogen, UA: 0.2 E.U./dL
pH, UA: 5 (ref 5.0–8.0)

## 2020-01-28 MED ORDER — ASPIRIN EC 81 MG PO TBEC: 81.0000 mg | DELAYED_RELEASE_TABLET | Freq: Every day | ORAL | 11 refills | Status: DC

## 2020-01-28 NOTE — Addendum Note (Signed)
Addended by: Brooke Dare on: 01/28/2020 02:35 PM   Modules accepted: Orders

## 2020-01-28 NOTE — Progress Notes (Addendum)
NEW OB HISTORY AND PHYSICAL  SUBJECTIVE:       Tasha Macias is a 34 y.o. [phone] female, Patient's last menstrual period was 10/03/2019 (exact date)., Estimated Date of Delivery: 07/08/20, [redacted]w[redacted]d, presents today for establishment of Prenatal Care. She has no unusual complaints.  Body mass index is 39.55 kg/m.  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 Drug-no  Gynecologic History Patient's last menstrual period was 10/03/2019 (exact date). Normal Contraception: none Last Pap: 02/2016 Results were: normal  Obstetric History OB History  Gravida Para Term Preterm AB Living  7 4 4  0 2 4  SAB IAB Ectopic Multiple Live Births  1 1 0 0 4    # Outcome Date GA Lbr Len/2nd Weight Sex Delivery Anes PTL Lv  7 Current           6 IAB 02/2019          5 Term 08/29/18 [redacted]w[redacted]d 07:04 / 00:33 7 lb 5.5 oz (3.33 kg) F Vag-Spont EPI  LIV  4 Term 05/26/11    F Vag-Spont   LIV  3 Term 03/08/10    M Vag-Spont   LIV  2 Term 08/17/07    M Vag-Spont   LIV  1 SAB 2008            Past Medical History:  Diagnosis Date   Preeclampsia 2009    Past Surgical History:  Procedure Laterality Date   DILATION AND CURETTAGE OF UTERUS      Current Outpatient Medications on File Prior to Visit  Medication Sig Dispense Refill   ferrous sulfate 325 (65 FE) MG tablet Take 1 tablet (325 mg total) by mouth daily with breakfast. (Patient not taking: Reported on 12/12/2019) 30 tablet 3   ibuprofen (ADVIL) 600 MG tablet Take 1 tablet (600 mg total) by mouth every 6 (six) hours. (Patient not taking: Reported on 12/12/2019) 30 tablet 0   Prenatal Vit-Fe Fumarate-FA (MULTIVITAMIN-PRENATAL) 27-0.8 MG TABS tablet Take 1 tablet by mouth daily at 12 noon. 100 tablet 0   Prenatal Vit-Fe Fumarate-FA (PRENATAL MULTIVITAMIN) TABS tablet Take 1 tablet by mouth daily at 12 noon. (Patient not taking: Reported on 12/12/2019)     No current facility-administered  medications on file prior to visit.    No Known Allergies  Social History   Socioeconomic History   Marital status: Single    Spouse name: Not on file   Number of children: Not on file   Years of education: Not on file   Highest education level: Not on file  Occupational History   Not on file  Tobacco Use   Smoking status: Former Smoker    Packs/day: 0.50    Types: Cigarettes   Smokeless tobacco: Never Used  Vaping Use   Vaping Use: Never used  Substance and Sexual Activity   Alcohol use: Not Currently    Alcohol/week: 2.0 standard drinks    Types: 2 Standard drinks or equivalent per week    Comment: last use 12/10/19   Drug use: Not Currently   Sexual activity: Yes    Birth control/protection: None    Comment: still deciding  Other Topics Concern   Not on file  Social History Narrative   Not on file   Social Determinants of Health   Financial Resource Strain: Not on file  Food Insecurity: Not on file  Transportation Needs: Not on file  Physical Activity: Not on file  Stress: Not on file  Social Connections: Not on file  Intimate Partner Violence: Not At Risk   Fear of Current or Ex-Partner: No   Emotionally Abused: No   Physically Abused: No   Sexually Abused: No    Family History  Problem Relation Age of Onset   Autism Son    Hypertension Maternal Grandmother    Breast cancer Neg Hx    Ovarian cancer Neg Hx    Colon cancer Neg Hx     The following portions of the patient's history were reviewed and updated as appropriate: allergies, current medications, past OB history, past medical history, past surgical history, past family history, past social history, and problem list.    OBJECTIVE: Initial Physical Exam (New OB)  GENERAL APPEARANCE: alert, well appearing, in no apparent distress, oriented to person, place and time, overweight HEAD: normocephalic, atraumatic MOUTH: mucous membranes moist, pharynx normal without  lesions THYROID: no thyromegaly or masses present BREASTS: no masses noted, no significant tenderness, no palpable axillary nodes, no skin changes, lactating, nurses her last child at nap and bedtime. LUNGS: clear to auscultation, no wheezes, rales or rhonchi, symmetric air entry HEART: regular rate and rhythm, no murmurs ABDOMEN: soft, nontender, nondistended, no abnormal masses, no epigastric pain, obese and FHT present EXTREMITIES: no redness or tenderness in the calves or thighs SKIN: normal coloration and turgor, no rashes LYMPH NODES: no adenopathy palpable NEUROLOGIC: alert, oriented, normal speech, no focal findings or movement disorder noted  PELVIC EXAM EXTERNAL GENITALIA: normal appearing vulva with no masses, tenderness or lesions VAGINA: no abnormal discharge or lesions CERVIX: no lesions or cervical motion tenderness, pap collected  UTERUS: gravid ADNEXA: no masses palpable and nontender OB EXAM PELVIMETRY: appears adequate RECTUM: exam not indicated  ASSESSMENT: Normal pregnancy  PLAN: New OB counseling: The patient has been given an overview regarding routine prenatal care. Recommendations regarding diet, weight gain (11-20 lbs) and exercise in pregnancy were given. Prenatal testing, optional genetic testing, carrier screening,and ultrasound use in pregnancy were reviewed. Discussed start of Asa today. Benefits of Breast Feeding were discussed. The patient is encouraged to consider nursing her baby post partum.  Doreene Burke, CNM

## 2020-01-28 NOTE — Patient Instructions (Signed)

## 2020-01-30 NOTE — Telephone Encounter (Signed)
Error

## 2020-02-05 ENCOUNTER — Telehealth: Payer: Self-pay

## 2020-02-05 LAB — CYTOLOGY - PAP
Comment: NEGATIVE
Diagnosis: NEGATIVE
High risk HPV: NEGATIVE

## 2020-02-05 NOTE — Telephone Encounter (Signed)
Informed pt panorama results were in. Requested results in an envelope. Pt verbalized understanding and will pick up results today.

## 2020-02-06 DIAGNOSIS — Z20828 Contact with and (suspected) exposure to other viral communicable diseases: Secondary | ICD-10-CM | POA: Diagnosis not present

## 2020-02-09 NOTE — L&D Delivery Note (Signed)
Delivery Note  In room to see patient, denies pelvic pressure or urge to push. Advised of variables decelerations on FHR tracing, patient and FOB agree to being second stage at 1050.   Effective coached maternal pushing efforts noted.   Spontaneous vaginal birth of liveborn female infant in left occiput anterior position at 1100 over intact perineum. Infant immediately to maternal abdomen. Delayed cord clamping, skin to skin, and three vessel cord double clamped and cut by FOB. Receiving RN present at bedside. APGARs: 8, 9. Weight pending.   Pitocin bolus infusing, see chart. Spontaneous delivery of intact placenta at 1104. Uterus firm. Rubra scant. Large amount of urine expressed during fundal rub, in and out catheter completed per usual fashion under sterile technique, approximately 100 ml. Vault check completed under adequate epidural anesthesia. QBL: pending. Counts correct x 2.   Initiate routine postpartum care and orders. Mom to postpartum.  Baby to Couplet care / Skin to Skin.  FOB present at bedside and overjoyed with the birth of "Kori".   Patient no longer desired bilateral tubal ligation, FOB plans vasectomy.    Serafina Royals, CNM [facility], [facility] 07/08/2020, 11:28 AM

## 2020-02-18 DIAGNOSIS — J310 Chronic rhinitis: Secondary | ICD-10-CM | POA: Diagnosis not present

## 2020-02-18 DIAGNOSIS — Z20828 Contact with and (suspected) exposure to other viral communicable diseases: Secondary | ICD-10-CM | POA: Diagnosis not present

## 2020-02-18 DIAGNOSIS — R519 Headache, unspecified: Secondary | ICD-10-CM | POA: Diagnosis not present

## 2020-02-28 ENCOUNTER — Ambulatory Visit (INDEPENDENT_AMBULATORY_CARE_PROVIDER_SITE_OTHER): Payer: Medicaid Other | Admitting: Certified Nurse Midwife

## 2020-02-28 ENCOUNTER — Other Ambulatory Visit: Payer: Self-pay

## 2020-02-28 ENCOUNTER — Other Ambulatory Visit: Payer: Medicaid Other

## 2020-02-28 ENCOUNTER — Other Ambulatory Visit: Payer: Self-pay | Admitting: Certified Nurse Midwife

## 2020-02-28 VITALS — BP 101/60 | HR 88 | Wt 235.4 lb

## 2020-02-28 DIAGNOSIS — Z3A21 21 weeks gestation of pregnancy: Secondary | ICD-10-CM

## 2020-02-28 DIAGNOSIS — O09299 Supervision of pregnancy with other poor reproductive or obstetric history, unspecified trimester: Secondary | ICD-10-CM

## 2020-02-28 DIAGNOSIS — Z3492 Encounter for supervision of normal pregnancy, unspecified, second trimester: Secondary | ICD-10-CM

## 2020-02-28 LAB — POCT URINALYSIS DIPSTICK OB
Bilirubin, UA: NEGATIVE
Blood, UA: NEGATIVE
Glucose, UA: NEGATIVE
Ketones, UA: NEGATIVE
Leukocytes, UA: NEGATIVE
Nitrite, UA: NEGATIVE
POC,PROTEIN,UA: NEGATIVE
Spec Grav, UA: 1.025 (ref 1.010–1.025)
Urobilinogen, UA: 0.2 E.U./dL
pH, UA: 5 (ref 5.0–8.0)

## 2020-02-28 NOTE — Progress Notes (Signed)
I have seen, interviewed, and examined the patient in conjunction with the [facility] [employer] and affirm the diagnosis and management plan.   Gunnar Bulla, CNM [facility], [facility] 02/28/20 12:27 PM

## 2020-02-28 NOTE — Patient Instructions (Signed)

## 2020-02-28 NOTE — Progress Notes (Signed)
ROB- Reports fatigue, occasional shortness of breath and mild back pain. Encouraged home treatment measures including use of abdominal support. Baseline labs today due to history of pre-eclampsia, see orders. Anatomy scan scheduled for 03/05/2020 in office. Anticipatory guidance regarding course of prenatal care. Reviewed red flag symptoms and when to call. ROB x 4 with ANNIE or sooner if needed.  Juliann Pares, Student-MidWife [employer] 02/28/20 12:15 PM

## 2020-02-29 LAB — COMPREHENSIVE METABOLIC PANEL
ALT: 9 IU/L (ref 0–32)
AST: 11 IU/L (ref 0–40)
Albumin/Globulin Ratio: 1.4 (ref 1.2–2.2)
Albumin: 3.6 g/dL — ABNORMAL LOW (ref 3.8–4.8)
Alkaline Phosphatase: 60 IU/L (ref 44–121)
BUN/Creatinine Ratio: 16 (ref 9–23)
BUN: 11 mg/dL (ref 6–20)
Bilirubin Total: 0.3 mg/dL (ref 0.0–1.2)
CO2: 21 mmol/L (ref 20–29)
Calcium: 8.6 mg/dL — ABNORMAL LOW (ref 8.7–10.2)
Chloride: 103 mmol/L (ref 96–106)
Creatinine, Ser: 0.67 mg/dL (ref 0.57–1.00)
GFR calc Af Amer: 135 mL/min/{1.73_m2} (ref >59)
GFR calc non Af Amer: 117 mL/min/{1.73_m2} (ref >59)
Globulin, Total: 2.5 g/dL (ref 1.5–4.5)
Glucose: 78 mg/dL (ref 65–99)
Potassium: 4 mmol/L (ref 3.5–5.2)
Sodium: 135 mmol/L (ref 134–144)
Total Protein: 6.1 g/dL (ref 6.0–8.5)

## 2020-02-29 LAB — CBC
Hematocrit: 32.9 % — ABNORMAL LOW (ref 34.0–46.6)
Hemoglobin: 10.8 g/dL — ABNORMAL LOW (ref 11.1–15.9)
MCH: 30 pg (ref 26.6–33.0)
MCHC: 32.8 g/dL (ref 31.5–35.7)
MCV: 91 fL (ref 79–97)
Platelets: 207 10*3/uL (ref 150–450)
RBC: 3.6 x10E6/uL — ABNORMAL LOW (ref 3.77–5.28)
RDW: 13.3 % (ref 11.7–15.4)
WBC: 6.8 10*3/uL (ref 3.4–10.8)

## 2020-02-29 LAB — PROTEIN / CREATININE RATIO, URINE
Creatinine, Urine: 242.3 mg/dL
Protein, Ur: 22.8 mg/dL
Protein/Creat Ratio: 94 mg/g creat (ref 0–200)

## 2020-03-05 ENCOUNTER — Ambulatory Visit (INDEPENDENT_AMBULATORY_CARE_PROVIDER_SITE_OTHER): Payer: Medicaid Other

## 2020-03-05 ENCOUNTER — Other Ambulatory Visit: Payer: Self-pay

## 2020-03-05 DIAGNOSIS — Z3A16 16 weeks gestation of pregnancy: Secondary | ICD-10-CM | POA: Diagnosis not present

## 2020-03-05 DIAGNOSIS — Z3482 Encounter for supervision of other normal pregnancy, second trimester: Secondary | ICD-10-CM

## 2020-03-05 DIAGNOSIS — Z3A22 22 weeks gestation of pregnancy: Secondary | ICD-10-CM | POA: Diagnosis not present

## 2020-03-12 ENCOUNTER — Other Ambulatory Visit: Payer: Self-pay

## 2020-03-12 ENCOUNTER — Other Ambulatory Visit: Payer: Self-pay | Admitting: Obstetrics and Gynecology

## 2020-03-12 NOTE — Patient Instructions (Signed)
Hi Tasha Macias, thank you for speaking with me today.  Tasha Macias was given information about Medicaid Managed Care team care coordination services as a part of their [facility] Medicaid benefit. Tasha Macias verbally consented to engagement with the [facility] Managed Care team.   For questions related to your [facility] health plan, please call: [phone]  If you would like to schedule transportation through your [facility] plan, please call the following number at least 2 days in advance of your appointment: [phone]  Tasha Macias - following are the goals we discussed in your visit today:  Goals Addressed            This Visit's Progress   . Protect My Health       Timeframe:  Long-Range Goal Priority:  High Start Date:       03/12/20                      Expected End Date:      06/09/20                 Follow Up Date 04/09/20   - schedule recommended health tests (blood work, mammogram, colonoscopy, pap test) - schedule and keep appointment for annual check-up       Patient verbalizes understanding of instructions provided today.   The Managed Medicaid care management team will reach out to the patient again over the next 7 days.  The patient has been provided with contact information for the Managed Medicaid care management team and has been advised to call with any health related questions or concerns.   Tasha Der RN, BSN [place]  [employer], [employer] - Managed Medicaid High Risk [phone].  Following is a copy of your plan of care:      Patient Care Plan: General Plan of Care (Adult)    Problem Identified: Health Promotion or Disease Self-Management (General Plan of Care)     Goal: Self-Management Plan Developed   Note:   Current Barriers:  . Patient [redacted] weeks gestation with UTI symptoms  fatigue  Nurse Case Manager Clinical Goal(s):  Marland Kitchen Over the next 90 days, patient will work with provider to address needs  related to pregnancy. . Over the next 90 days, patient will attend all scheduled medical appointments:  Interventions:  . Inter-disciplinary care team collaboration (see longitudinal plan of care) . Evaluation of current treatment plan related to pregnancy and patient's adherence to plan as established by provider. . Advised patient to contact provider for UTI symptoms, fatigue. . Reviewed medications with patient. . Discussed plans with patient for ongoing care management follow up and provided patient with direct contact information for care management team . Reviewed scheduled/upcoming provider appointments.  Patient Goals/Self-Care Activities Over the next 90 days, patient will:  -Patient will  Self administers medications as prescribed Attends all scheduled provider appointments Calls pharmacy for medication refills Calls provider office for new concerns or questions  Follow Up Plan: The Managed Medicaid care management team will reach out to the patient again over the next 7 days.  The patient has been provided with contact information for the Managed Medicaid care management team and has been advised to call with any health related questions or concerns.

## 2020-03-12 NOTE — Patient Outreach (Signed)
Medicaid Managed Care   Nurse Care Manager Note  03/12/2020 Name:  Tasha Macias MRN:  [id number] DOB:  May 18, 1985  Tasha Macias is an 35 y.o. year old female who is a primary patient of Patient, No Pcp Per.  The [facility] Managed Care Coordination team was consulted for assistance with:    Obstetrics healthcare management needs  Ms. Mengel was given information about [employer] team services today. Lawernce Ion Silbernagel agreed to services and verbal consent obtained.  Engaged with patient by telephone for initial visit in response to provider referral for case management and/or care coordination services.   Assessments/Interventions:  Review of past medical history, allergies, medications, health status, including review of consultants reports, laboratory and other test data, was performed as part of comprehensive evaluation and provision of chronic care management services.  SDOH (Social Determinants of Health) assessments and interventions performed:   Care Plan  No Known Allergies  Medications Reviewed Today    Reviewed by Danie Chandler, RN (Registered Nurse) on 03/12/20 at 1254  Med List Status: <None>  Medication Order Taking? Sig Documenting Provider Last Dose Status Informant  aspirin EC 81 MG tablet [id number] Yes Take 1 tablet (81 mg total) by mouth daily. Swallow whole. Doreene Burke, CNM Taking Active   Prenatal Vit-Fe Fumarate-FA (MULTIVITAMIN-PRENATAL) 27-0.8 MG TABS tablet [id number] Yes Take 1 tablet by mouth daily at 12 noon. Federico Flake, MD Taking Active           Patient Active Problem List   Diagnosis Date Noted  . 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 History of pre-eclampsia 08/28/2018  . Encounter for repeat ultrasound of fetal pyelectasis in singleton pregnancy, antepartum 05/12/2018  . Abnormal pap 01/10/2012    Conditions to be addressed/monitored: obstetric healthcare management.        Problem: Health Promotion or  Disease Self-Management (General Plan of Care)     Care Plan : General Plan of Care (Adult)  Updates made by Danie Chandler, RN since 03/12/2020 12:00 AM      Goal: Self-Management Plan Developed   Note:   Current Barriers:  . Patient [redacted] weeks gestation with UTI symptoms  fatigue  Nurse Case Manager Clinical Goal(s):  Marland Kitchen Over the next 90 days, patient will work with provider to address needs related to pregnancy. . Over the next 90 days, patient will attend all scheduled medical appointments:  Interventions:  . Inter-disciplinary care team collaboration (see longitudinal plan of care) . Evaluation of current treatment plan related to pregnancy and patient's adherence to plan as established by provider. . Advised patient to contact provider for UTI symptoms, fatigue. . Reviewed medications with patient. . Discussed plans with patient for ongoing care management follow up and provided patient with direct contact information for care management team . Reviewed scheduled/upcoming provider appointments.  Patient Goals/Self-Care Activities Over the next 90 days, patient will:  -Patient will  Self administers medications as prescribed Attends all scheduled provider appointments Calls pharmacy for medication refills Calls provider office for new concerns or questions  Follow Up Plan: The Managed Medicaid care management team will reach out to the patient again over the next 7 days.  The patient has been provided with contact information for the Managed Medicaid care management team and has been advised to call with any health related questions or concerns.            Follow Up:  Patient agrees to Care Plan and Follow-up.  Plan: The Managed  Medicaid care management team will reach out to the patient again over the next 7 days. and The patient has been provided with contact information for the Managed Medicaid care management team and has been advised to call with any health related  questions or concerns.  Date/time of next scheduled RN care management/care coordination outreach:  03/19/20 at 1030

## 2020-03-19 ENCOUNTER — Other Ambulatory Visit: Payer: Self-pay | Admitting: Obstetrics and Gynecology

## 2020-03-19 NOTE — Patient Instructions (Signed)
Hi [name], sorry we missed you today.  - as a part of your Medicaid benefit, you are eligible for care management and care coordination services at no cost or copay. I was unable to reach you by phone today but would be happy to help you with your health related needs. Please feel free to call me at [phone].   A member of the Managed Medicaid care management team will reach out to you again over the next 7 days.   Kathi Der RN, BSN [place]  [employer], production - Managed Medicaid High Risk [phone].

## 2020-03-19 NOTE — Patient Outreach (Signed)
Care Coordination  03/19/2020  Roxsana Riding Junkins 01-21-1986 [id number]    Medicaid Managed Care   Unsuccessful Outreach Note  03/19/2020 Name: Tasha Macias MRN: [id number] DOB: 27-Sep-1985  Referred by: Patient, No Pcp Per Reason for referral : High Risk Managed Medicaid (Unsuccessful telephone outreach)   An unsuccessful telephone outreach was attempted today. The patient was referred to the case management team for assistance with care management and care coordination.   Follow Up Plan: A member of the Managed Medicaid  care management team will reach out to the patient again over the next 7 days.   Kathi Der RN, BSN [place]  [employer], production - Managed Medicaid High Risk [phone].

## 2020-03-26 ENCOUNTER — Encounter: Payer: Medicaid Other | Admitting: Certified Nurse Midwife

## 2020-03-26 DIAGNOSIS — Z3A25 25 weeks gestation of pregnancy: Secondary | ICD-10-CM

## 2020-04-01 ENCOUNTER — Telehealth: Payer: Self-pay | Admitting: General Practice

## 2020-04-01 NOTE — Telephone Encounter (Signed)
I attempted to reach out to [employer] today to get her rescheduled with the [facility] on the Managed Medicaid team. She did not answer and her VM was full. I will try again in the next 7-14 days.

## 2020-04-02 ENCOUNTER — Encounter: Payer: Medicaid Other | Admitting: Certified Nurse Midwife

## 2020-04-02 ENCOUNTER — Other Ambulatory Visit: Payer: Self-pay

## 2020-04-02 ENCOUNTER — Encounter: Payer: Self-pay | Admitting: Certified Nurse Midwife

## 2020-04-02 ENCOUNTER — Ambulatory Visit (INDEPENDENT_AMBULATORY_CARE_PROVIDER_SITE_OTHER): Payer: Medicaid Other | Admitting: Certified Nurse Midwife

## 2020-04-02 VITALS — BP 115/62 | HR 89 | Wt 243.4 lb

## 2020-04-02 DIAGNOSIS — O99343 Other mental disorders complicating pregnancy, third trimester: Secondary | ICD-10-CM

## 2020-04-02 DIAGNOSIS — F419 Anxiety disorder, unspecified: Secondary | ICD-10-CM

## 2020-04-02 DIAGNOSIS — Z3A26 26 weeks gestation of pregnancy: Secondary | ICD-10-CM

## 2020-04-02 DIAGNOSIS — Z3492 Encounter for supervision of normal pregnancy, unspecified, second trimester: Secondary | ICD-10-CM

## 2020-04-02 LAB — POCT URINALYSIS DIPSTICK OB
Bilirubin, UA: NEGATIVE
Glucose, UA: NEGATIVE
Ketones, UA: NEGATIVE
Nitrite, UA: NEGATIVE
Odor: NEGATIVE
POC,PROTEIN,UA: NEGATIVE
Spec Grav, UA: >=1.03 — AB (ref 1.010–1.025)
Urobilinogen, UA: 0.2 E.U./dL
pH, UA: 6 (ref 5.0–8.0)

## 2020-04-02 MED ORDER — SERTRALINE HCL 50 MG PO TABS: 50.0000 mg | ORAL_TABLET | Freq: Every day | ORAL | 3 refills | Status: DC

## 2020-04-02 NOTE — Patient Instructions (Signed)
 Glucose Tolerance Test Why am I having this test? The glucose tolerance test (GTT) is done to check how your body processes sugar (glucose). This is one of several tests used to diagnose diabetes (diabetes mellitus). Your health care provider may recommend this test if you:  Have a family history of diabetes.  Are obese.  Have infections that keep coming back.  Have had a lot of wounds that did not heal quickly, especially on your legs and feet.  Are a woman and have a history of giving birth to very large babies or a history of repeated fetal loss (stillbirth).  Have had high glucose levels in your urine or blood: ? During a past pregnancy. ? After a heart attack, surgery, or prolonged periods of high stress. What is being tested? This test measures the amount of glucose in your blood at different times during a period of 2 hours. This indicates how well your body is able to process glucose. What kind of sample is taken? Blood samples are required for this test. They are usually collected by inserting a needle into a blood vessel.   How do I prepare for this test?  For 3 days before your test, eat normally. Have plenty of carbohydrate-rich foods.  Follow instructions from your health care provider about: ? Eating or drinking restrictions on the day of the test. You may be asked to not eat or drink anything other than water (fast) starting 8-12 hours before the test. ? Changing or stopping your regular medicines. Some medicines may interfere with this test. Tell a health care provider about:  All medicines you are taking, including vitamins, herbs, eye drops, creams, and over-the-counter medicines.  Any blood disorders you have.  Any surgeries you have had.  Any medical conditions you have.  Whether you are pregnant or may be pregnant. What happens during the test? First, your blood glucose will be measured. This is referred to as your fasting blood glucose, since you  fasted before the test. Then, you will drink a glucose solution that contains a specific amount of glucose. Your blood glucose will be measured again 1 and 2 hours after drinking the solution. This test takes 2 hours to complete. You will need to stay at the testing location during this time. During the testing period:  Do not eat or drink anything other than the glucose solution. You will be allowed to drink water.  Do not exercise.  Do not use any products that contain nicotine or tobacco. These products include cigarettes, chewing tobacco, and vaping devices, such as e-cigarettes. If you need help quitting, ask your health care provider. The testing procedure may vary among health care providers and hospitals. How are the results reported? Your test results will be reported as values. These will be given as milligrams of glucose per deciliter of blood (mg/dL) or millimoles per liter (mmol/L). Your health care provider will compare your results to normal ranges that were established after testing a large group of people (reference ranges). Reference ranges may vary among labs and hospitals. For this test, common reference ranges are:  Fasting: less than 110 mg/dL (6.1 mmol/L).  1 hour after drinking glucose: less than 180 mg/dL (10.0 mmol/L).  2 hours after drinking glucose: less than 140 mg/dL (7.8 mmol/L). What do the results mean? Results that are within the reference ranges are considered normal, meaning that your glucose levels are well controlled. Results higher than the reference ranges may mean that you recently experienced   stress, such as from an injury or a sudden (acute) condition like a heart attack or stroke, or that you have:  Diabetes.  Cushing syndrome.  Tumors such as pheochromocytoma or glucagonoma.  Kidney failure.  Pancreatitis.  Hyperthyroidism.  An infection. Talk with your health care provider about what your results mean. Questions to ask your health care  provider Ask your health care provider, or the department that is doing the test:  When will my results be ready?  How will I get my results?  What are my treatment options?  What other tests do I need?  What are my next steps? Summary  The GTT is done to check how your body processes glucose. This is one of several tests used to diagnose diabetes.  This test measures the amount of glucose in your blood at different times during a period of 2 hours. This indicates how well your body is able to process glucose.  Talk with your health care provider about what your results mean. This information is not intended to replace advice given to you by your health care provider. Make sure you discuss any questions you have with your health care provider. Document Revised: 10/24/2019 Document Reviewed: 10/24/2019 Elsevier Patient Education  2021 Elsevier Inc.  

## 2020-04-02 NOTE — Progress Notes (Signed)
ROB , feels good movement. PT c/o of anxiety , feeling overwhelemed and having panic attacks that make her feel like she can not breath.  GAD 7 : Generalized Anxiety Score 04/02/2020  Nervous, Anxious, on Edge 3  Control/stop worrying 3  Worry too much - different things 3  Trouble relaxing 1  Restless 1  Easily annoyed or irritable 3  Afraid - awful might happen 1  Total GAD 7 Score 15    Flowsheet Row Routine Prenatal from 04/02/2020 in [facility]  PHQ-9 Total Score 13     Pt is tearful today. She denies history of anxiety and depression in the past. Discussed use of medciations and counseling she is open to both. Mother to baby fact sheet given for zoloft. Orders placed zoloft 50 mg daily. Referral placed for counseling. [employer]/gallery conservator.  Discussed 28 wk labs next visit. She verbalizes and agrees . Follow up 2 wk with Marcelino Duster

## 2020-04-02 NOTE — Progress Notes (Signed)
ROB- ? Anxiety with SOB.

## 2020-04-17 ENCOUNTER — Encounter: Payer: Medicaid Other | Admitting: Certified Nurse Midwife

## 2020-04-17 ENCOUNTER — Other Ambulatory Visit: Payer: Medicaid Other

## 2020-04-21 ENCOUNTER — Other Ambulatory Visit: Payer: Medicaid Other

## 2020-04-21 ENCOUNTER — Encounter: Payer: Medicaid Other | Admitting: Certified Nurse Midwife

## 2020-04-21 ENCOUNTER — Other Ambulatory Visit: Payer: Self-pay

## 2020-04-21 DIAGNOSIS — Z3483 Encounter for supervision of other normal pregnancy, third trimester: Secondary | ICD-10-CM

## 2020-04-21 NOTE — Patient Instructions (Incomplete)
Common Medications Safe in Pregnancy  Acne:      Constipation:  Benzoyl Peroxide     Colace  Clindamycin      Dulcolax Suppository  Topica Erythromycin     Fibercon  Salicylic Acid      Metamucil         Miralax AVOID:        Senakot   Accutane    Cough:  Retin-A       Cough Drops  Tetracycline      Phenergan w/ Codeine if Rx  Minocycline      Robitussin (Plain & DM)  Antibiotics:     Crabs/Lice:  Ceclor       RID  Cephalosporins    AVOID:  E-Mycins      Kwell  Keflex  Macrobid/Macrodantin   Diarrhea:  Penicillin      Kao-Pectate  Zithromax      Imodium AD         PUSH FLUIDS AVOID:       Cipro     Fever:  Tetracycline      Tylenol (Regular or Extra  Minocycline       Strength)  Levaquin      Extra Strength-Do not          Exceed 8 tabs/24 hrs Caffeine:        <200mg/day (equiv. To 1 cup of coffee or  approx. 3 12 oz sodas)         Gas: Cold/Hayfever:       Gas-X  Benadryl      Mylicon  Claritin       Phazyme  **Claritin-D        Chlor-Trimeton    Headaches:  Dimetapp      ASA-Free Excedrin  Drixoral-Non-Drowsy     Cold Compress  Mucinex (Guaifenasin)     Tylenol (Regular or Extra  Sudafed/Sudafed-12 Hour     Strength)  **Sudafed PE Pseudoephedrine   Tylenol Cold & Sinus     Vicks Vapor Rub  Zyrtec  **AVOID if Problems With Blood Pressure         Heartburn: Avoid lying down for at least 1 hour after meals  Aciphex      Maalox     Rash:  Milk of Magnesia     Benadryl    Mylanta       1% Hydrocortisone Cream  Pepcid  Pepcid Complete   Sleep Aids:  Prevacid      Ambien   Prilosec       Benadryl  Rolaids       Chamomile Tea  Tums (Limit 4/day)     Unisom         Tylenol PM         Warm milk-add vanilla or  Hemorrhoids:       Sugar for taste  Anusol/Anusol H.C.  (RX: Analapram 2.5%)  Sugar Substitutes:  Hydrocortisone OTC     Ok in moderation  Preparation H      Tucks        Vaseline lotion applied to tissue with  wiping    Herpes:     Throat:  Acyclovir      Oragel  Famvir  Valtrex     Vaccines:         Flu Shot Leg Cramps:       *Gardasil  Benadryl      Hepatitis A         Hepatitis B Nasal Spray:         Pneumovax  Saline Nasal Spray     Polio Booster         Tetanus Nausea:       Tuberculosis test or PPD  Vitamin B6 25 mg TID   AVOID:    Dramamine      *Gardasil  Emetrol       Live Poliovirus  Ginger Root 250 mg QID    MMR (measles, mumps &  High Complex Carbs @ Bedtime    rebella)  Sea Bands-Accupressure    Varicella (Chickenpox)  Unisom 1/2 tab TID     *No known complications           If received before Pain:         Known pregnancy;   Darvocet       Resume series after  Lortab        Delivery  Percocet    Yeast:   Tramadol      Femstat  Tylenol 3      Gyne-lotrimin  Ultram       Monistat  Vicodin           MISC:         All Sunscreens           Hair Coloring/highlights          Insect Repellant's          (Including DEET)         Mystic Tans Breastfeeding  Choosing to breastfeed is one of the best decisions you can make for yourself and your baby. A change in hormones during pregnancy causes your breasts to make breast milk in your milk-producing glands. Hormones prevent breast milk from being released before your baby is born. They also prompt milk flow after birth. Once breastfeeding has begun, thoughts of your baby, as well as his or her sucking or crying, can stimulate the release of milk from your milk-producing glands. Benefits of breastfeeding Research shows that breastfeeding offers many health benefits for infants and mothers. It also offers a cost-free and convenient way to feed your baby. For your baby  Your first milk (colostrum) helps your baby's digestive system to function better.  Special cells in your milk (antibodies) help your baby to fight off infections.  Breastfed babies are less likely to develop asthma, allergies, obesity, or type 2 diabetes. They  are also at lower risk for sudden infant death syndrome (SIDS).  Nutrients in breast milk are better able to meet your baby's needs compared to infant formula.  Breast milk improves your baby's brain development. For you  Breastfeeding helps to create a very special bond between you and your baby.  Breastfeeding is convenient. Breast milk costs nothing and is always available at the correct temperature.  Breastfeeding helps to burn calories. It helps you to lose the weight that you gained during pregnancy.  Breastfeeding makes your uterus return faster to its size before pregnancy. It also slows bleeding (lochia) after you give birth.  Breastfeeding helps to lower your risk of developing type 2 diabetes, osteoporosis, rheumatoid arthritis, cardiovascular disease, and breast, ovarian, uterine, and endometrial cancer later in life. Breastfeeding basics Starting breastfeeding  Find a comfortable place to sit or lie down, with your neck and back well-supported.  Place a pillow or a rolled-up blanket under your baby to bring him or her to the level of your breast (if you are seated). Nursing pillows are specially designed to help support your arms and your baby while you  breastfeed.  Make sure that your baby's tummy (abdomen) is facing your abdomen.  Gently massage your breast. With your fingertips, massage from the outer edges of your breast inward toward the nipple. This encourages milk flow. If your milk flows slowly, you may need to continue this action during the feeding.  Support your breast with 4 fingers underneath and your thumb above your nipple (make the letter "C" with your hand). Make sure your fingers are well away from your nipple and your baby's mouth.  Stroke your baby's lips gently with your finger or nipple.  When your baby's mouth is open wide enough, quickly bring your baby to your breast, placing your entire nipple and as much of the areola as possible into your baby's  mouth. The areola is the colored area around your nipple. ? More areola should be visible above your baby's upper lip than below the lower lip. ? Your baby's lips should be opened and extended outward (flanged) to ensure an adequate, comfortable latch. ? Your baby's tongue should be between his or her lower gum and your breast.  Make sure that your baby's mouth is correctly positioned around your nipple (latched). Your baby's lips should create a seal on your breast and be turned out (everted).  It is common for your baby to suck about 2-3 minutes in order to start the flow of breast milk. Latching Teaching your baby how to latch onto your breast properly is very important. An improper latch can cause nipple pain, decreased milk supply, and poor weight gain in your baby. Also, if your baby is not latched onto your nipple properly, he or she may swallow some air during feeding. This can make your baby fussy. Burping your baby when you switch breasts during the feeding can help to get rid of the air. However, teaching your baby to latch on properly is still the best way to prevent fussiness from swallowing air while breastfeeding. Signs that your baby has successfully latched onto your nipple  Silent tugging or silent sucking, without causing you pain. Infant's lips should be extended outward (flanged).  Swallowing heard between every 3-4 sucks once your milk has started to flow (after your let-down milk reflex occurs).  Muscle movement above and in front of his or her ears while sucking. Signs that your baby has not successfully latched onto your nipple  Sucking sounds or smacking sounds from your baby while breastfeeding.  Nipple pain. If you think your baby has not latched on correctly, slip your finger into the corner of your baby's mouth to break the suction and place it between your baby's gums. Attempt to start breastfeeding again. Signs of successful breastfeeding Signs from your  baby  Your baby will gradually decrease the number of sucks or will completely stop sucking.  Your baby will fall asleep.  Your baby's body will relax.  Your baby will retain a small amount of milk in his or her mouth.  Your baby will let go of your breast by himself or herself. Signs from you  Breasts that have increased in firmness, weight, and size 1-3 hours after feeding.  Breasts that are softer immediately after breastfeeding.  Increased milk volume, as well as a change in milk consistency and color by the fifth day of breastfeeding.  Nipples that are not sore, cracked, or bleeding. Signs that your baby is getting enough milk  Wetting at least 1-2 diapers during the first 24 hours after birth.  Wetting at least 5-6  diapers every 24 hours for the first week after birth. The urine should be clear or pale yellow by the age of 5 days.  Wetting 6-8 diapers every 24 hours as your baby continues to grow and develop.  At least 3 stools in a 24-hour period by the age of 5 days. The stool should be soft and yellow.  At least 3 stools in a 24-hour period by the age of 7 days. The stool should be seedy and yellow.  No loss of weight greater than 10% of birth weight during the first 3 days of life.  Average weight gain of 4-7 oz (113-198 g) per week after the age of 4 days.  Consistent daily weight gain by the age of 5 days, without weight loss after the age of 2 weeks. After a feeding, your baby may spit up a small amount of milk. This is normal. Breastfeeding frequency and duration Frequent feeding will help you make more milk and can prevent sore nipples and extremely full breasts (breast engorgement). Breastfeed when you feel the need to reduce the fullness of your breasts or when your baby shows signs of hunger. This is called "breastfeeding on demand." Signs that your baby is hungry include:  Increased alertness, activity, or restlessness.  Movement of the head from side to  side.  Opening of the mouth when the corner of the mouth or cheek is stroked (rooting).  Increased sucking sounds, smacking lips, cooing, sighing, or squeaking.  Hand-to-mouth movements and sucking on fingers or hands.  Fussing or crying. Avoid introducing a pacifier to your baby in the first 4-6 weeks after your baby is born. After this time, you may choose to use a pacifier. Research has shown that pacifier use during the first year of a baby's life decreases the risk of sudden infant death syndrome (SIDS). Allow your baby to feed on each breast as long as he or she wants. When your baby unlatches or falls asleep while feeding from the first breast, offer the second breast. Because newborns are often sleepy in the first few weeks of life, you may need to awaken your baby to get him or her to feed. Breastfeeding times will vary from baby to baby. However, the following rules can serve as a guide to help you make sure that your baby is properly fed:  Newborns (babies 4 weeks of age or younger) may breastfeed every 1-3 hours.  Newborns should not go without breastfeeding for longer than 3 hours during the day or 5 hours during the night.  You should breastfeed your baby a minimum of 8 times in a 24-hour period. Breast milk pumping Pumping and storing breast milk allows you to make sure that your baby is exclusively fed your breast milk, even at times when you are unable to breastfeed. This is especially important if you go back to work while you are still breastfeeding, or if you are not able to be present during feedings. Your lactation consultant can help you find a method of pumping that works best for you and give you guidelines about how long it is safe to store breast milk.      Caring for your breasts while you breastfeed Nipples can become dry, cracked, and sore while breastfeeding. The following recommendations can help keep your breasts moisturized and healthy:  Avoid using soap on  your nipples.  Wear a supportive bra designed especially for nursing. Avoid wearing underwire-style bras or extremely tight bras (sports bras).  Air-dry   your nipples for 3-4 minutes after each feeding.  Use only cotton bra pads to absorb leaked breast milk. Leaking of breast milk between feedings is normal.  Use lanolin on your nipples after breastfeeding. Lanolin helps to maintain your skin's normal moisture barrier. Pure lanolin is not harmful (not toxic) to your baby. You may also hand express a few drops of breast milk and gently massage that milk into your nipples and allow the milk to air-dry. In the first few weeks after giving birth, some women experience breast engorgement. Engorgement can make your breasts feel heavy, warm, and tender to the touch. Engorgement peaks within 3-5 days after you give birth. The following recommendations can help to ease engorgement:  Completely empty your breasts while breastfeeding or pumping. You may want to start by applying warm, moist heat (in the shower or with warm, water-soaked hand towels) just before feeding or pumping. This increases circulation and helps the milk flow. If your baby does not completely empty your breasts while breastfeeding, pump any extra milk after he or she is finished.  Apply ice packs to your breasts immediately after breastfeeding or pumping, unless this is too uncomfortable for you. To do this: ? Put ice in a plastic bag. ? Place a towel between your skin and the bag. ? Leave the ice on for 20 minutes, 2-3 times a day.  Make sure that your baby is latched on and positioned properly while breastfeeding. If engorgement persists after 48 hours of following these recommendations, contact your health care provider or a Science writer. Overall health care recommendations while breastfeeding  Eat 3 healthy meals and 3 snacks every day. Well-nourished mothers who are breastfeeding need an additional 450-500 calories a day.  You can meet this requirement by increasing the amount of a balanced diet that you eat.  Drink enough water to keep your urine pale yellow or clear.  Rest often, relax, and continue to take your prenatal vitamins to prevent fatigue, stress, and low vitamin and mineral levels in your body (nutrient deficiencies).  Do not use any products that contain nicotine or tobacco, such as cigarettes and e-cigarettes. Your baby may be harmed by chemicals from cigarettes that pass into breast milk and exposure to secondhand smoke. If you need help quitting, ask your health care provider.  Avoid alcohol.  Do not use illegal drugs or marijuana.  Talk with your health care provider before taking any medicines. These include over-the-counter and prescription medicines as well as vitamins and herbal supplements. Some medicines that may be harmful to your baby can pass through breast milk.  It is possible to become pregnant while breastfeeding. If birth control is desired, ask your health care provider about options that will be safe while breastfeeding your baby. Where to find more information: [employer]: www.llli.org Contact a health care provider if:  You feel like you want to stop breastfeeding or have become frustrated with breastfeeding.  Your nipples are cracked or bleeding.  Your breasts are red, tender, or warm.  You have: ? Painful breasts or nipples. ? A swollen area on either breast. ? A fever or chills. ? Nausea or vomiting. ? Drainage other than breast milk from your nipples.  Your breasts do not become full before feedings by the fifth day after you give birth.  You feel sad and depressed.  Your baby is: ? Too sleepy to eat well. ? Having trouble sleeping. ? More than 66 week old and wetting fewer than  6 diapers in a 24-hour period. ? Not gaining weight by 17 days of age.  Your baby has fewer than 3 stools in a 24-hour period.  Your baby's skin or the white  parts of his or her eyes become yellow. Get help right away if:  Your baby is overly tired (lethargic) and does not want to wake up and feed.  Your baby develops an unexplained fever. Summary  Breastfeeding offers many health benefits for infant and mothers.  Try to breastfeed your infant when he or she shows early signs of hunger.  Gently tickle or stroke your baby's lips with your finger or nipple to allow the baby to open his or her mouth. Bring the baby to your breast. Make sure that much of the areola is in your baby's mouth. Offer one side and burp the baby before you offer the other side.  Talk with your health care provider or lactation consultant if you have questions or you face problems as you breastfeed. This information is not intended to replace advice given to you by your health care provider. Make sure you discuss any questions you have with your health care provider. Document Revised: 04/21/2017 Document Reviewed: 02/27/2016 Elsevier Patient Education  2021 [employer].

## 2020-05-01 ENCOUNTER — Other Ambulatory Visit: Payer: Self-pay

## 2020-05-01 ENCOUNTER — Other Ambulatory Visit: Payer: Medicaid Other

## 2020-05-01 ENCOUNTER — Ambulatory Visit (INDEPENDENT_AMBULATORY_CARE_PROVIDER_SITE_OTHER): Payer: Medicaid Other | Admitting: Certified Nurse Midwife

## 2020-05-01 VITALS — BP 120/80 | HR 96

## 2020-05-01 DIAGNOSIS — Z131 Encounter for screening for diabetes mellitus: Secondary | ICD-10-CM

## 2020-05-01 DIAGNOSIS — Z3A30 30 weeks gestation of pregnancy: Secondary | ICD-10-CM

## 2020-05-01 DIAGNOSIS — Z3403 Encounter for supervision of normal first pregnancy, third trimester: Secondary | ICD-10-CM

## 2020-05-01 DIAGNOSIS — Z130 Encounter for screening for diseases of the blood and blood-forming organs and certain disorders involving the immune mechanism: Secondary | ICD-10-CM

## 2020-05-01 DIAGNOSIS — Z113 Encounter for screening for infections with a predominantly sexual mode of transmission: Secondary | ICD-10-CM

## 2020-05-01 LAB — POCT URINALYSIS DIPSTICK OB
Bilirubin, UA: NEGATIVE
Glucose, UA: NEGATIVE
Ketones, UA: NEGATIVE
Nitrite, UA: NEGATIVE
Spec Grav, UA: >=1.03 — AB (ref 1.010–1.025)
Urobilinogen, UA: 0.2 E.U./dL
pH, UA: 6.5 (ref 5.0–8.0)

## 2020-05-01 NOTE — Progress Notes (Signed)
ROB- Reports occasional SOB that is improving, back pain, and fatigue when standing for log periods of time. Discussed the use of compression socks and belly support. Encouraged to take Zoloft daily, currently taking two (2) to three (3) times per week. Tubal consent and blood consent signed today. 28 week labs today, see orders. TDaP declined. Third trimester and waterbirth handouts provided. Breastfeeding education completed, see chart. Anticipatory guidance regarding course of prenatal care. Reviewed red flag symptoms and when to call. RTC x 2 weeks for ROB with ANNIE or sooner if needed.   Juliann Pares, Student-MidWife [employer] 05/01/20 3:13 PM  GAD 7 : Generalized Anxiety Score 04/02/2020  Nervous, Anxious, on Edge 3  Control/stop worrying 3  Worry too much - different things 3  Trouble relaxing 1  Restless 1  Easily annoyed or irritable 3  Afraid - awful might happen 1  Total GAD 7 Score 15    Depression screen [facility] 2/9 05/01/2020 04/02/2020 12/12/2019  Decreased Interest 1 1 1   Down, Depressed, Hopeless 1 1 1   PHQ - 2 Score 2 2 2   Altered sleeping 3 2 1   Tired, decreased energy 2 3 1   Change in appetite 2 0 3  Feeling bad or failure about yourself  1 1 1   Trouble concentrating 0 3 1  Moving slowly or fidgety/restless 1 1 2   Suicidal thoughts 0 1 0  PHQ-9 Score 11 13 11   Difficult doing work/chores Somewhat difficult - Very difficult

## 2020-05-01 NOTE — Patient Instructions (Signed)
Back Pain in Pregnancy Back pain during pregnancy is common. Back pain may be caused by several factors that are related to changes during your pregnancy. Follow these instructions at home: Managing pain, stiffness, and swelling  If directed, for sudden (acute) back pain, put ice on the painful area. ? Put ice in a plastic bag. ? Place a towel between your skin and the bag. ? Leave the ice on for 20 minutes, 2-3 times per day.  If directed, apply heat to the affected area before you exercise. Use the heat source that your health care provider recommends, such as a moist heat pack or a heating pad. ? Place a towel between your skin and the heat source. ? Leave the heat on for 20-30 minutes. ? Remove the heat if your skin turns bright red. This is especially important if you are unable to feel pain, heat, or cold. You may have a greater risk of getting burned.  If directed, massage the affected area.      Activity  Exercise as told by your health care provider. Gentle exercise is the best way to prevent or manage back pain.  Listen to your body when lifting. If lifting hurts, ask for help or bend your knees. This uses your leg muscles instead of your back muscles.  Squat down when picking up something from the floor. Do not bend over.  Only use bed rest for short periods as told by your health care provider. Bed rest should only be used for the most severe episodes of back pain. Standing, sitting, and lying down  Do not stand in one place for long periods of time.  Use good posture when sitting. Make sure your head rests over your shoulders and is not hanging forward. Use a pillow on your lower back if necessary.  Try sleeping on your side, preferably the left side, with a pregnancy support pillow or 1-2 regular pillows between your legs. ? If you have back pain after a night's rest, your bed may be too soft. ? A firm mattress may provide more support for your back during  pregnancy. General instructions  Do not wear high heels.  Eat a healthy diet. Try to gain weight within your health care provider's recommendations.  Use a maternity girdle, elastic sling, or back brace as told by your health care provider.  Take over-the-counter and prescription medicines only as told by your health care provider.  Work with a physical therapist or massage therapist to find ways to manage back pain. Acupuncture or massage therapy may be helpful.  Keep all follow-up visits as told by your health care provider. This is important. Contact a health care provider if:  Your back pain interferes with your daily activities.  You have increasing pain in other parts of your body. Get help right away if:  You develop numbness, tingling, weakness, or problems with the use of your arms or legs.  You develop severe back pain that is not controlled with medicine.  You have a change in bowel or bladder control.  You develop shortness of breath, dizziness, or you faint.  You develop nausea, vomiting, or sweating.  You have back pain that is a rhythmic, cramping pain similar to labor pains. Labor pain is usually 1-2 minutes apart, lasts for about 1 minute, and involves a bearing down feeling or pressure in your pelvis.  You have back pain and your water breaks or you have vaginal bleeding.  You have back pain  or numbness that travels down your leg.  Your back pain developed after you fell.  You develop pain on one side of your back.  You see blood in your urine.  You develop skin blisters in the area of your back pain. Summary  Back pain may be caused by several factors that are related to changes during your pregnancy.  Follow instructions as told by your health care provider for managing pain, stiffness, and swelling.  Exercise as told by your health care provider. Gentle exercise is the best way to prevent or manage back pain.  Take over-the-counter and  prescription medicines only as told by your health care provider.  Keep all follow-up visits as told by your health care provider. This is important. This information is not intended to replace advice given to you by your health care provider. Make sure you discuss any questions you have with your health care provider. Document Revised: 05/16/2018 Document Reviewed: 07/13/2017 Elsevier Patient Education  2021 [employer].   Third Trimester of Pregnancy  The third trimester of pregnancy is from week 28 through week 40. This is also called months 7 through 9. This trimester is when your unborn baby (fetus) is growing very fast. At the end of the ninth month, the unborn baby is about 20 inches long. It weighs about 6-10 pounds. Body changes during your third trimester Your body continues to go through many changes during this time. The changes vary and generally return to normal after the baby is born. Physical changes  Your weight will continue to increase. You may gain 25-35 pounds (11-16 kg) by the end of the pregnancy. If you are underweight, you may gain 28-40 lb (about 13-18 kg). If you are overweight, you may gain 15-25 lb (about 7-11 kg).  You may start to get stretch marks on your hips, belly (abdomen), and breasts.  Your breasts will continue to grow and may hurt. A yellow fluid (colostrum) may leak from your breasts. This is the first milk you are making for your baby.  You may have changes in your hair.  Your belly button may stick out.  You may have more swelling in your hands, face, or ankles. Health changes  You may have heartburn.  You may have trouble pooping (constipation).  You may get hemorrhoids. These are swollen veins in the butt that can itch or get painful.  You may have swollen veins (varicose veins) in your legs.  You may have more body aches in the pelvis, back, or thighs.  You may have more tingling or numbness in your hands, arms, and legs. The skin  on your belly may also feel numb.  You may feel short of breath as your womb (uterus) gets bigger. Other changes  You may pee (urinate) more often.  You may have more problems sleeping.  You may notice the unborn baby "dropping," or moving lower in your belly.  You may have more discharge coming from your vagina.  Your joints may feel loose, and you may have pain around your pelvic bone. Follow these instructions at home: Medicines  Take over-the-counter and prescription medicines only as told by your doctor. Some medicines are not safe during pregnancy.  Take a prenatal vitamin that contains at least 600 micrograms (mcg) of folic acid. Eating and drinking  Eat healthy meals that include: ? Fresh fruits and vegetables. ? Whole grains. ? Good sources of protein, such as meat, eggs, or tofu. ? Low-fat dairy products.  Avoid  raw meat and unpasteurized juice, milk, and cheese. These carry germs that can harm you and your baby.  Eat 4 or 5 small meals rather than 3 large meals a day.  You may need to take these actions to prevent or treat trouble pooping: ? Drink enough fluids to keep your pee (urine) pale yellow. ? Eat foods that are high in fiber. These include beans, whole grains, and fresh fruits and vegetables. ? Limit foods that are high in fat and sugar. These include fried or sweet foods. Activity  Exercise only as told by your doctor. Stop exercising if you start to have cramps in your womb.  Avoid heavy lifting.  Do not exercise if it is too hot or too humid, or if you are in a place of great height (high altitude).  If you choose to, you may have sex unless your doctor tells you not to. Relieving pain and discomfort  Take breaks often, and rest with your legs raised (elevated) if you have leg cramps or low back pain.  Take warm water baths (sitz baths) to soothe pain or discomfort caused by hemorrhoids. Use hemorrhoid cream if your doctor approves.  Wear a  good support bra if your breasts are tender.  If you develop bulging, swollen veins in your legs: ? Wear support hose as told by your doctor. ? Raise your feet for 15 minutes, 3-4 times a day. ? Limit salt in your food. Safety  Talk to your doctor before traveling far distances.  Do not use hot tubs, steam rooms, or saunas.  Wear your seat belt at all times when you are in a car.  Talk with your doctor if someone is hurting you or yelling at you a lot. Preparing for your baby's arrival To prepare for the arrival of your baby:  Take prenatal classes.  Visit the hospital and tour the maternity area.  Buy a rear-facing car seat. Learn how to install it in your car.  Prepare the baby's room. Take out all pillows and stuffed animals from the baby's crib. General instructions  Avoid cat litter boxes and soil used by cats. These carry germs that can cause harm to the baby and can cause a loss of your baby by miscarriage or stillbirth.  Do not douche or use tampons. Do not use scented sanitary pads.  Do not smoke or use any products that contain nicotine or tobacco. If you need help quitting, ask your doctor.  Do not drink alcohol.  Do not use herbal medicines, illegal drugs, or medicines that were not approved by your doctor. Chemicals in these products can affect your baby.  Keep all follow-up visits. This is important. Where to find more information  American Pregnancy Association: americanpregnancy.org  [employer]: www.acog.org  Office on Women's Health: MightyRew[employer]co.nz Contact a doctor if:  You have a fever.  You have mild cramps or pressure in your lower belly.  You have a nagging pain in your belly area.  You vomit, or you have watery poop (diarrhea).  You have bad-smelling fluid coming from your vagina.  You have pain when you pee, or your pee smells bad.  You have a headache that does not go away when you  take medicine.  You have changes in how you see, or you see spots in front of your eyes. Get help right away if:  Your water breaks.  You have regular contractions that are less than 5 minutes apart.  You  are spotting or bleeding from your vagina.  You have very bad belly cramps or pain.  You have trouble breathing.  You have chest pain.  You faint.  You have not felt the baby move for the amount of time told by your doctor.  You have new or increased pain, swelling, or redness in an arm or leg. Summary  The third trimester is from week 28 through week 40 (months 7 through 9). This is the time when your unborn baby is growing very fast.  During this time, your discomfort may increase as you gain weight and as your baby grows.  Get ready for your baby to arrive by taking prenatal classes, buying a rear-facing car seat, and preparing the baby's room.  Get help right away if you are bleeding from your vagina, you have chest pain and trouble breathing, or you have not felt the baby move for the amount of time told by your doctor. This information is not intended to replace advice given to you by your health care provider. Make sure you discuss any questions you have with your health care provider. Document Revised: 07/04/2019 Document Reviewed: 05/10/2019 Elsevier Patient Education  2021 [employer]

## 2020-05-01 NOTE — Progress Notes (Signed)
ROB: Patient states she is still having some sob, but she is feeling much better. She has been having regular back pain, she is unable to stand for long periods of time. Her regular daily activities have been harder to complete.

## 2020-05-01 NOTE — Progress Notes (Signed)
I have seen, interviewed, and examined the patient in conjunction with the [facility] [employer] and affirm the diagnosis and management plan.   Gunnar Bulla, CNM [facility], [facility] 05/01/20 4:47 PM

## 2020-05-02 LAB — CBC
Hematocrit: 32.2 % — ABNORMAL LOW (ref 34.0–46.6)
Hemoglobin: 10.5 g/dL — ABNORMAL LOW (ref 11.1–15.9)
MCH: 28.1 pg (ref 26.6–33.0)
MCHC: 32.6 g/dL (ref 31.5–35.7)
MCV: 86 fL (ref 79–97)
Platelets: 207 10*3/uL (ref 150–450)
RBC: 3.74 x10E6/uL — ABNORMAL LOW (ref 3.77–5.28)
RDW: 14 % (ref 11.7–15.4)
WBC: 6.4 10*3/uL (ref 3.4–10.8)

## 2020-05-02 LAB — B12 AND FOLATE PANEL
Folate: 8.8 ng/mL (ref >3.0)
Vitamin B-12: 289 pg/mL (ref 232–1245)

## 2020-05-02 LAB — GLUCOSE, 1 HOUR GESTATIONAL: Gestational Diabetes Screen: 106 mg/dL (ref 65–139)

## 2020-05-02 LAB — RPR: RPR Ser Ql: NONREACTIVE

## 2020-05-05 ENCOUNTER — Other Ambulatory Visit: Payer: Self-pay | Admitting: Certified Nurse Midwife

## 2020-05-05 MED ORDER — FUSION PLUS PO CAPS: 1.0000 | ORAL_CAPSULE | Freq: Every day | ORAL | 6 refills | Status: DC

## 2020-05-14 ENCOUNTER — Other Ambulatory Visit: Payer: Self-pay

## 2020-05-14 ENCOUNTER — Encounter: Payer: Self-pay | Admitting: Certified Nurse Midwife

## 2020-05-14 ENCOUNTER — Ambulatory Visit (INDEPENDENT_AMBULATORY_CARE_PROVIDER_SITE_OTHER): Payer: Medicaid Other | Admitting: Certified Nurse Midwife

## 2020-05-14 VITALS — BP 119/70 | HR 105 | Wt 248.5 lb

## 2020-05-14 DIAGNOSIS — Z3A32 32 weeks gestation of pregnancy: Secondary | ICD-10-CM

## 2020-05-14 DIAGNOSIS — Z3403 Encounter for supervision of normal first pregnancy, third trimester: Secondary | ICD-10-CM

## 2020-05-14 LAB — POCT URINALYSIS DIPSTICK OB
Appearance: ABNORMAL
Bilirubin, UA: POSITIVE
Blood, UA: POSITIVE
Glucose, UA: NEGATIVE
Ketones, UA: NEGATIVE
Leukocytes, UA: NEGATIVE
Nitrite, UA: NEGATIVE
Odor: ABNORMAL
Spec Grav, UA: 1.025 (ref 1.010–1.025)
Urobilinogen, UA: 0.2 E.U./dL
pH, UA: 6 (ref 5.0–8.0)

## 2020-05-14 NOTE — Progress Notes (Signed)
ROB doing well. Feels good movement. No new complaints today. Pt state she is planing a water birth . ACNM and [employer] position statements given. Pt encouraged to sign up for class on April 21 through web site.  Discussed to bring in certificate once she completes class then will have her sign consent form. She verbalizes and agree. Follow up 2 wk with Marcelino Duster for ROB.    Doreene Burke, CNM

## 2020-05-14 NOTE — Patient Instructions (Signed)
Fetal Movement Counts Patient Name: ________________________________________________ Patient Due Date: ____________________  What is a fetal movement count? A fetal movement count is the number of times that you feel your baby move during a certain amount of time. This may also be called a fetal kick count. A fetal movement count is recommended for every pregnant woman. You may be asked to start counting fetal movements as early as week 28 of your pregnancy. Pay attention to when your baby is most active. You may notice your baby's sleep and wake cycles. You may also notice things that make your baby move more. You should do a fetal movement count:  When your baby is normally most active.  At the same time each day. A good time to count movements is while you are resting, after having something to eat and drink. How do I count fetal movements? 1. Find a quiet, comfortable area. Sit, or lie down on your side. 2. Write down the date, the start time and stop time, and the number of movements that you felt between those two times. Take this information with you to your health care visits. 3. Write down your start time when you feel the first movement. 4. Count kicks, flutters, swishes, rolls, and jabs. You should feel at least 10 movements. 5. You may stop counting after you have felt 10 movements, or if you have been counting for 2 hours. Write down the stop time. 6. If you do not feel 10 movements in 2 hours, contact your health care provider for further instructions. Your health care provider may want to do additional tests to assess your baby's well-being. Contact a health care provider if:  You feel fewer than 10 movements in 2 hours.  Your baby is not moving like he or she usually does. Date: ____________ Start time: ____________ Stop time: ____________ Movements: ____________ Date: ____________ Start time: ____________ Stop time: ____________ Movements: ____________ Date: ____________  Start time: ____________ Stop time: ____________ Movements: ____________ Date: ____________ Start time: ____________ Stop time: ____________ Movements: ____________ Date: ____________ Start time: ____________ Stop time: ____________ Movements: ____________ Date: ____________ Start time: ____________ Stop time: ____________ Movements: ____________ Date: ____________ Start time: ____________ Stop time: ____________ Movements: ____________ Date: ____________ Start time: ____________ Stop time: ____________ Movements: ____________ Date: ____________ Start time: ____________ Stop time: ____________ Movements: ____________ This information is not intended to replace advice given to you by your health care provider. Make sure you discuss any questions you have with your health care provider. Document Revised: 09/14/2018 Document Reviewed: 09/14/2018 Elsevier Patient Education  2021 Elsevier Inc.  

## 2020-05-14 NOTE — Progress Notes (Signed)
ROB: she complains of lower back pain and sob. Unable to leave urine sample at intake.

## 2020-05-16 DIAGNOSIS — Z0289 Encounter for other administrative examinations: Secondary | ICD-10-CM

## 2020-05-21 ENCOUNTER — Encounter: Payer: Self-pay | Admitting: Surgical

## 2020-05-30 ENCOUNTER — Telehealth: Payer: Self-pay

## 2020-05-30 NOTE — Telephone Encounter (Signed)
This was my 2nd attempt to reach Ms.Amescua to get her rescheduled for a phone visit with the [facility] RNCM. Ileft my name and number on her VM.

## 2020-06-02 ENCOUNTER — Encounter: Payer: Self-pay | Admitting: Certified Nurse Midwife

## 2020-06-02 ENCOUNTER — Other Ambulatory Visit: Payer: Self-pay

## 2020-06-02 ENCOUNTER — Ambulatory Visit (INDEPENDENT_AMBULATORY_CARE_PROVIDER_SITE_OTHER): Payer: Medicaid Other | Admitting: Certified Nurse Midwife

## 2020-06-02 VITALS — BP 116/73 | HR 94 | Wt 246.7 lb

## 2020-06-02 DIAGNOSIS — O2603 Excessive weight gain in pregnancy, third trimester: Secondary | ICD-10-CM

## 2020-06-02 DIAGNOSIS — Z3493 Encounter for supervision of normal pregnancy, unspecified, third trimester: Secondary | ICD-10-CM

## 2020-06-02 DIAGNOSIS — O26893 Other specified pregnancy related conditions, third trimester: Secondary | ICD-10-CM

## 2020-06-02 DIAGNOSIS — Z3A34 34 weeks gestation of pregnancy: Secondary | ICD-10-CM

## 2020-06-02 DIAGNOSIS — O0930 Supervision of pregnancy with insufficient antenatal care, unspecified trimester: Secondary | ICD-10-CM

## 2020-06-02 DIAGNOSIS — Z3403 Encounter for supervision of normal first pregnancy, third trimester: Secondary | ICD-10-CM

## 2020-06-02 DIAGNOSIS — O2600 Excessive weight gain in pregnancy, unspecified trimester: Secondary | ICD-10-CM

## 2020-06-02 DIAGNOSIS — R300 Dysuria: Secondary | ICD-10-CM

## 2020-06-02 LAB — POCT URINALYSIS DIPSTICK OB
Bilirubin, UA: NEGATIVE
Glucose, UA: NEGATIVE
Ketones, UA: NEGATIVE
Leukocytes, UA: NEGATIVE
Nitrite, UA: NEGATIVE
Spec Grav, UA: 1.02 (ref 1.010–1.025)
Urobilinogen, UA: 0.2 E.U./dL
pH, UA: 6.5 (ref 5.0–8.0)

## 2020-06-02 NOTE — Progress Notes (Signed)
ROB: Doing well, no new concerns today.

## 2020-06-02 NOTE — Patient Instructions (Signed)
Fetal Movement Counts Patient Name: ________________________________________________ Patient Due Date: ____________________  What is a fetal movement count? A fetal movement count is the number of times that you feel your baby move during a certain amount of time. This may also be called a fetal kick count. A fetal movement count is recommended for every pregnant woman. You may be asked to start counting fetal movements as early as week 28 of your pregnancy. Pay attention to when your baby is most active. You may notice your baby's sleep and wake cycles. You may also notice things that make your baby move more. You should do a fetal movement count:  When your baby is normally most active.  At the same time each day. A good time to count movements is while you are resting, after having something to eat and drink. How do I count fetal movements? 1. Find a quiet, comfortable area. Sit, or lie down on your side. 2. Write down the date, the start time and stop time, and the number of movements that you felt between those two times. Take this information with you to your health care visits. 3. Write down your start time when you feel the first movement. 4. Count kicks, flutters, swishes, rolls, and jabs. You should feel at least 10 movements. 5. You may stop counting after you have felt 10 movements, or if you have been counting for 2 hours. Write down the stop time. 6. If you do not feel 10 movements in 2 hours, contact your health care provider for further instructions. Your health care provider may want to do additional tests to assess your baby's well-being. Contact a health care provider if:  You feel fewer than 10 movements in 2 hours.  Your baby is not moving like he or she usually does. Date: ____________ Start time: ____________ Stop time: ____________ Movements: ____________ Date: ____________ Start time: ____________ Stop time: ____________ Movements: ____________ Date: ____________  Start time: ____________ Stop time: ____________ Movements: ____________ Date: ____________ Start time: ____________ Stop time: ____________ Movements: ____________ Date: ____________ Start time: ____________ Stop time: ____________ Movements: ____________ Date: ____________ Start time: ____________ Stop time: ____________ Movements: ____________ Date: ____________ Start time: ____________ Stop time: ____________ Movements: ____________ Date: ____________ Start time: ____________ Stop time: ____________ Movements: ____________ Date: ____________ Start time: ____________ Stop time: ____________ Movements: ____________ This information is not intended to replace advice given to you by your health care provider. Make sure you discuss any questions you have with your health care provider. Document Revised: 09/14/2018 Document Reviewed: 09/14/2018 Elsevier Patient Education  2021 Elsevier Inc.  

## 2020-06-02 NOTE — Progress Notes (Signed)
ROB-Reports pain with urination, has been taking Azo tablets intermittently for the last few months. Urine for culture, see orders. Missed appointments due to traveling with family. Still looking for housing, but situation is stable at this time. TWG 66 pounds discussed with patient.Referral to MFM for growth ultrasound, see orders. Anticipatory guidance regarding course of prenatal care. Reviewed red flag symptoms and when to call. RTC x 2 weeks for 36 week cultures and ROB with ANNIE or sooner if needed.

## 2020-06-05 LAB — URINE CULTURE

## 2020-06-09 ENCOUNTER — Other Ambulatory Visit: Payer: Self-pay | Admitting: Certified Nurse Midwife

## 2020-06-09 DIAGNOSIS — O2603 Excessive weight gain in pregnancy, third trimester: Secondary | ICD-10-CM

## 2020-06-09 DIAGNOSIS — O0933 Supervision of pregnancy with insufficient antenatal care, third trimester: Secondary | ICD-10-CM

## 2020-06-09 DIAGNOSIS — O99213 Obesity complicating pregnancy, third trimester: Secondary | ICD-10-CM

## 2020-06-10 ENCOUNTER — Ambulatory Visit: Payer: Medicaid Other | Attending: Maternal & Fetal Medicine

## 2020-06-10 ENCOUNTER — Other Ambulatory Visit: Payer: Self-pay

## 2020-06-10 DIAGNOSIS — O99213 Obesity complicating pregnancy, third trimester: Secondary | ICD-10-CM | POA: Diagnosis not present

## 2020-06-10 DIAGNOSIS — Z3A36 36 weeks gestation of pregnancy: Secondary | ICD-10-CM | POA: Diagnosis not present

## 2020-06-10 DIAGNOSIS — E669 Obesity, unspecified: Secondary | ICD-10-CM

## 2020-06-10 DIAGNOSIS — O2603 Excessive weight gain in pregnancy, third trimester: Secondary | ICD-10-CM

## 2020-06-10 DIAGNOSIS — O0933 Supervision of pregnancy with insufficient antenatal care, third trimester: Secondary | ICD-10-CM
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
## 2020-06-17 ENCOUNTER — Encounter: Payer: Self-pay | Admitting: Certified Nurse Midwife

## 2020-06-17 ENCOUNTER — Other Ambulatory Visit (HOSPITAL_COMMUNITY)
Admission: RE | Admit: 2020-06-17 | Discharge: 2020-06-17 | Disposition: A | Discharge status: Home / Self Care | Payer: Medicaid Other | Source: Ambulatory Visit | Attending: Certified Nurse Midwife | Admitting: Certified Nurse Midwife

## 2020-06-17 ENCOUNTER — Ambulatory Visit (INDEPENDENT_AMBULATORY_CARE_PROVIDER_SITE_OTHER): Payer: Medicaid Other | Admitting: Certified Nurse Midwife

## 2020-06-17 ENCOUNTER — Other Ambulatory Visit: Payer: Self-pay

## 2020-06-17 VITALS — BP 114/75 | HR 87 | Wt 248.3 lb

## 2020-06-17 DIAGNOSIS — Z3A37 37 weeks gestation of pregnancy: Secondary | ICD-10-CM

## 2020-06-17 DIAGNOSIS — Z3403 Encounter for supervision of normal first pregnancy, third trimester: Secondary | ICD-10-CM

## 2020-06-17 LAB — POCT URINALYSIS DIPSTICK OB
Bilirubin, UA: POSITIVE
Glucose, UA: NEGATIVE
Ketones, UA: POSITIVE
Nitrite, UA: NEGATIVE
Spec Grav, UA: 1.02 (ref 1.010–1.025)
Urobilinogen, UA: 0.2 E.U./dL
pH, UA: 6.5 (ref 5.0–8.0)

## 2020-06-17 NOTE — Progress Notes (Signed)
ROB doing well. Feels good movement. Pt had u/s with MFM and was told she is breech. Fluid 15.5 and growth 57 th percentile. Discussed findings with pt. Leopolds today fetal head right upper quadrant heart tones auscultated left upper quadrant. Pt declines SVE today. Discussed spinning babies and moxibustion to encouraged rotation. Reviewed version or primary cesarean section. PT encouraged to see MD asap to discuss version procedure /c section and schedule. She verbalizes and agrees to plan.   Doreene Burke, CNM

## 2020-06-17 NOTE — Patient Instructions (Signed)

## 2020-06-17 NOTE — Progress Notes (Signed)
ROB: Doing well, no new concerns. GBS and G/C done today

## 2020-06-18 ENCOUNTER — Ambulatory Visit (INDEPENDENT_AMBULATORY_CARE_PROVIDER_SITE_OTHER): Payer: Medicaid Other | Admitting: Obstetrics and Gynecology

## 2020-06-18 ENCOUNTER — Other Ambulatory Visit: Payer: Self-pay

## 2020-06-18 ENCOUNTER — Encounter: Payer: Self-pay | Admitting: Obstetrics and Gynecology

## 2020-06-18 VITALS — BP 109/65 | HR 85 | Wt 254.1 lb

## 2020-06-18 DIAGNOSIS — O99213 Obesity complicating pregnancy, third trimester: Secondary | ICD-10-CM | POA: Diagnosis not present

## 2020-06-18 DIAGNOSIS — Z3483 Encounter for supervision of other normal pregnancy, third trimester: Secondary | ICD-10-CM | POA: Diagnosis not present

## 2020-06-18 DIAGNOSIS — O321XX0 Maternal care for breech presentation, not applicable or unspecified: Secondary | ICD-10-CM | POA: Diagnosis not present

## 2020-06-18 DIAGNOSIS — Z3A37 37 weeks gestation of pregnancy: Secondary | ICD-10-CM | POA: Diagnosis not present

## 2020-06-18 NOTE — Progress Notes (Signed)
OB-Pt present today for visit with MD due to   breech baby.

## 2020-06-18 NOTE — Progress Notes (Signed)
ROB Consultation: Patient presents from midwifery service due to recent findings of breech presentation on ultrasound.  Is for further counseling of management options. Discussion had on minimally invasive maneuvers such as use of moxibustion, acupuncture, or home positioning techniques (given Spinning Babies website information). Also discussed risks and benefits of ECV or cesarean delivery.  She currently has plans for waterbirth.  Physical exam notes mobile fetus, ultrasound with normal AFI, fundal placenta. Bedside sono today with head to maternal right. Would be a good candidate for version if desired. Patient to go home and try home maneuvers first, and then will decide by next week how she would further like to proceed. 36 week cultures performed yesterday. Unable to void today. RTC in 1 week.    A total of 15 minutes were spent face-to-face with the patient during this encounter and over half of that time dealt with counseling and coordination of care.

## 2020-06-18 NOTE — Patient Instructions (Signed)
Breech Birth A breech birth is when a baby is born with the buttocks or feet first. Most babies are in a head down (vertex) position when they are born. There are three types of breech babies:  When the baby's buttocks are showing first in the vagina (birth canal) with the legs bent at the knees and the feet down near the buttocks (complete breech).  When the baby's buttocks are showing first in the birth canal with the legs straight up and the feet at the baby's head (frank breech).  When one or both of the baby's feet are showing first in the birth canal along with the buttocks (footling breech). What increases the risk of having a breech baby? It is not known what causes your baby to be breech. However, you are more likely to have a breech baby if:  You have had a previous pregnancy.  You are having more than one baby.  Your baby has certain birth (congenital) defects.  You have started your labor earlier than expected (premature labor).  You have problems with your uterus, such as a growths or an abnormally-shaped uterus.  You have too much or not enough fluid surrounding the baby (amniotic fluid).  The placenta covers all or part of the opening of the uterus (placenta previa). How does this affect me? There are no symptoms for you to know that your baby is breech. When you are close to your due date, your health care provider can tell if your baby is breech by doing:  An abdominal or vaginal (pelvic) exam.  An ultrasound. You and your health care provider will discuss the best way to deliver your baby. If your baby is breech, it is less likely that a vaginal delivery will be recommended due to the risks to you and your baby. How does this affect my baby? Having a breech birth increases the health risks to your baby. A breech birth may cause the following:  Umbilical cord prolapse. This is when the umbilical cord enters the birth canal ahead of the baby, before or during labor.  This can cause the cord to become pinched or compressed as labor continues. This can reduce the flow of blood and oxygen to the baby.  The baby getting stuck in the birth canal, which can cause injury or, rarely, death.  Injury to the baby's nerves in the shoulder, arm, and hand (brachial plexus injury) when delivered. How is this treated? Your health care provider may try to turn the baby in your uterus. He or she will use a procedure called external cephalic version (ECV). He or she will place both hands on your abdomen and gently and slowly turn the baby around. It is important to know that ECV can increase your chances of suddenly going into labor. For this reason, an ECV is only done toward the end of a healthy pregnancy. The baby may remain in this position, but sometimes he or she may turn back to the breech position. You and your health care provider will discuss if an ECV is recommended for you and your baby. Your health care provider may recommend that you deliver your baby through a cesarean delivery (C-section). A C-section is the surgical delivery of a baby through an incision in the abdomen and the uterus.   Contact a health care provider if: Get help right away if:  Your baby is breech and you have regular painful contractions or loss of your mucus plug (bloody show).  Your   water breaks.  You see the baby's umbilical cord protruding from your vagina. Summary  A breech birth is when a baby is born with the buttocks or feet first.  Having a breech birth may increase the risks to your baby.  Your health care provider may try to turn your baby in your uterus using a procedure called an external cephalic version (ECV).  If your baby cannot be turned to a head down position or if your baby remains in a breech position, your health care provider will make recommendations about the safest way to deliver your baby. This information is not intended to replace advice given to you by  your health care provider. Make sure you discuss any questions you have with your health care provider. Document Revised: 11/12/2019 Document Reviewed: 11/12/2019 Elsevier Patient Education  2021 Elsevier Inc.  

## 2020-06-19 LAB — CERVICOVAGINAL ANCILLARY ONLY
Chlamydia: NEGATIVE
Comment: NEGATIVE
Comment: NORMAL
Neisseria Gonorrhea: NEGATIVE

## 2020-06-19 LAB — STREP GP B NAA: Strep Gp B NAA: NEGATIVE

## 2020-06-23 ENCOUNTER — Other Ambulatory Visit: Payer: Self-pay

## 2020-06-23 ENCOUNTER — Ambulatory Visit (INDEPENDENT_AMBULATORY_CARE_PROVIDER_SITE_OTHER): Payer: Medicaid Other | Admitting: Certified Nurse Midwife

## 2020-06-23 VITALS — BP 106/72 | HR 109 | Wt 249.3 lb

## 2020-06-23 DIAGNOSIS — O321XX0 Maternal care for breech presentation, not applicable or unspecified: Secondary | ICD-10-CM

## 2020-06-23 DIAGNOSIS — Z3A37 37 weeks gestation of pregnancy: Secondary | ICD-10-CM

## 2020-06-23 DIAGNOSIS — Z3403 Encounter for supervision of normal first pregnancy, third trimester: Secondary | ICD-10-CM

## 2020-06-23 LAB — POCT URINALYSIS DIPSTICK OB
Bilirubin, UA: NEGATIVE
Blood, UA: POSITIVE
Glucose, UA: NEGATIVE
Ketones, UA: NEGATIVE
Nitrite, UA: NEGATIVE
Spec Grav, UA: 1.02 (ref 1.010–1.025)
Urobilinogen, UA: 0.2 E.U./dL
pH, UA: 6.5 (ref 5.0–8.0)

## 2020-06-23 NOTE — Progress Notes (Signed)
ROB-Doing well, reports limited attempts at home treatment measures. Fetus remains in breech/transverse presentation. Moxibustion stick given. ECV scheduled with Dr. Valentino Saxon on Wednesday, 06/25/2020, at 1100; Dr. Valentino Saxon to place orders. Anticipatory guidance regarding course of prenatal care. Reviewed red flag symptoms and when to call. RTC x 1 week for ROB or sooner if needed.

## 2020-06-23 NOTE — Patient Instructions (Signed)
Breech Birth A breech birth is when a baby is born with the buttocks or feet first. Most babies are in a head down (vertex) position when they are born. There are three types of breech babies:  When the baby's buttocks are showing first in the vagina (birth canal) with the legs bent at the knees and the feet down near the buttocks (complete breech).  When the baby's buttocks are showing first in the birth canal with the legs straight up and the feet at the baby's head (frank breech).  When one or both of the baby's feet are showing first in the birth canal along with the buttocks (footling breech). What increases the risk of having a breech baby? It is not known what causes your baby to be breech. However, you are more likely to have a breech baby if:  You have had a previous pregnancy.  You are having more than one baby.  Your baby has certain birth (congenital) defects.  You have started your labor earlier than expected (premature labor).  You have problems with your uterus, such as a growths or an abnormally-shaped uterus.  You have too much or not enough fluid surrounding the baby (amniotic fluid).  The placenta covers all or part of the opening of the uterus (placenta previa). How does this affect me? There are no symptoms for you to know that your baby is breech. When you are close to your due date, your health care provider can tell if your baby is breech by doing:  An abdominal or vaginal (pelvic) exam.  An ultrasound. You and your health care provider will discuss the best way to deliver your baby. If your baby is breech, it is less likely that a vaginal delivery will be recommended due to the risks to you and your baby. How does this affect my baby? Having a breech birth increases the health risks to your baby. A breech birth may cause the following:  Umbilical cord prolapse. This is when the umbilical cord enters the birth canal ahead of the baby, before or during labor.  This can cause the cord to become pinched or compressed as labor continues. This can reduce the flow of blood and oxygen to the baby.  The baby getting stuck in the birth canal, which can cause injury or, rarely, death.  Injury to the baby's nerves in the shoulder, arm, and hand (brachial plexus injury) when delivered. How is this treated? Your health care provider may try to turn the baby in your uterus. He or she will use a procedure called external cephalic version (ECV). He or she will place both hands on your abdomen and gently and slowly turn the baby around. It is important to know that ECV can increase your chances of suddenly going into labor. For this reason, an ECV is only done toward the end of a healthy pregnancy. The baby may remain in this position, but sometimes he or she may turn back to the breech position. You and your health care provider will discuss if an ECV is recommended for you and your baby. Your health care provider may recommend that you deliver your baby through a cesarean delivery (C-section). A C-section is the surgical delivery of a baby through an incision in the abdomen and the uterus.   Contact a health care provider if: Get help right away if:  Your baby is breech and you have regular painful contractions or loss of your mucus plug (bloody show).  Your   water breaks.  You see the baby's umbilical cord protruding from your vagina. Summary  A breech birth is when a baby is born with the buttocks or feet first.  Having a breech birth may increase the risks to your baby.  Your health care provider may try to turn your baby in your uterus using a procedure called an external cephalic version (ECV).  If your baby cannot be turned to a head down position or if your baby remains in a breech position, your health care provider will make recommendations about the safest way to deliver your baby. This information is not intended to replace advice given to you by  your health care provider. Make sure you discuss any questions you have with your health care provider. Document Revised: 11/12/2019 Document Reviewed: 11/12/2019 Elsevier Patient Education  2021 Elsevier Inc.  

## 2020-06-23 NOTE — Progress Notes (Signed)
ROB: No new concerns today.

## 2020-06-24 ENCOUNTER — Other Ambulatory Visit: Payer: Self-pay | Admitting: Obstetrics and Gynecology

## 2020-06-25 ENCOUNTER — Encounter: Payer: Self-pay | Admitting: Obstetrics and Gynecology

## 2020-06-25 ENCOUNTER — Other Ambulatory Visit: Payer: Self-pay | Admitting: Certified Nurse Midwife

## 2020-06-25 ENCOUNTER — Other Ambulatory Visit: Payer: Self-pay

## 2020-06-25 ENCOUNTER — Observation Stay
Admission: RE | Admit: 2020-06-25 | Discharge: 2020-06-25 | Disposition: A | Discharge status: Home / Self Care | Payer: Medicaid Other | Attending: Obstetrics and Gynecology | Admitting: Obstetrics and Gynecology

## 2020-06-25 DIAGNOSIS — O321XX0 Maternal care for breech presentation, not applicable or unspecified: Secondary | ICD-10-CM | POA: Diagnosis present

## 2020-06-25 DIAGNOSIS — O99213 Obesity complicating pregnancy, third trimester: Secondary | ICD-10-CM

## 2020-06-25 DIAGNOSIS — O321XX1 Maternal care for breech presentation, fetus 1: Secondary | ICD-10-CM | POA: Diagnosis not present

## 2020-06-25 DIAGNOSIS — Z3A38 38 weeks gestation of pregnancy: Secondary | ICD-10-CM

## 2020-06-25 DIAGNOSIS — O320XX0 Maternal care for unstable lie, not applicable or unspecified: Secondary | ICD-10-CM

## 2020-06-25 DIAGNOSIS — O99210 Obesity complicating pregnancy, unspecified trimester: Secondary | ICD-10-CM

## 2020-06-25 DIAGNOSIS — Z3490 Encounter for supervision of normal pregnancy, unspecified, unspecified trimester: Secondary | ICD-10-CM

## 2020-06-25 LAB — CBC
HCT: 33.6 % — ABNORMAL LOW (ref 36.0–46.0)
Hemoglobin: 10.5 g/dL — ABNORMAL LOW (ref 12.0–15.0)
MCH: 25.6 pg — ABNORMAL LOW (ref 26.0–34.0)
MCHC: 31.3 g/dL (ref 30.0–36.0)
MCV: 82 fL (ref 80.0–100.0)
Platelets: 223 10*3/uL (ref 150–400)
RBC: 4.1 MIL/uL (ref 3.87–5.11)
RDW: 16.3 % — ABNORMAL HIGH (ref 11.5–15.5)
WBC: 6.4 10*3/uL (ref 4.0–10.5)
nRBC: 0.3 % — ABNORMAL HIGH (ref 0.0–0.2)

## 2020-06-25 LAB — TYPE AND SCREEN
ABO/RH(D): A POS
Antibody Screen: NEGATIVE

## 2020-06-25 MED ORDER — TERBUTALINE SULFATE 1 MG/ML IJ SOLN
0.2500 mg | Freq: Once | INTRAMUSCULAR | Status: AC
  Administered 2020-06-25: 0.25 mg via SUBCUTANEOUS
  Filled 2020-06-25: qty 1

## 2020-06-25 MED ORDER — LACTATED RINGERS IV BOLUS
1000.0000 mL | Freq: Once | INTRAVENOUS | Status: AC
  Administered 2020-06-25: 1000 mL via INTRAVENOUS

## 2020-06-25 MED ORDER — MINERAL OIL PO OIL
45.0000 mL | TOPICAL_OIL | Freq: Once | ORAL | Status: DC
  Filled 2020-06-25 (×2): qty 45

## 2020-06-25 NOTE — Progress Notes (Signed)
Orders for admission, see orders.    Serafina Royals, CNM [facility], [facility] 06/25/20 12:45 PM

## 2020-06-25 NOTE — Procedures (Addendum)
    EXTERNAL CEPHALIC VERSION Procedure Note  Tasha Macias female 35 y.o. 06/25/2020  Indications: The patient is a 35 y.o. [id number] female at [redacted]w[redacted]d with fetal malpresentation (breech, oblique lie).  The patient has been previously counseled on risks of the procedure including uterine contractions with progression to labor, fetal distress, rupture of membranes, failure of procedure requiring Cesarean delivery, vaginal bleeding, abruption, and fetal death. A consent form has been signed for the procedure.    Pre-operative Diagnosis: A 38-week intrauterine pregnancy in breech presentation.   Post-operative Diagnosis: A 38-week intrauterine pregnancy in vertex presentation, status post successful external cephalic version.  Surgeon: Hildred Laser, MD  Assistants: Serafina Royals. An experienced assistant was required for fetal manipulation, and retraction.   Anesthesia: None  Procedure Details: The patient was brought to Labor and Delivery where a reactive fetal heart tracing was obtained. The patient was not noted to have any uterine contractions.. She was given 1 dose of subcutaneous terbutaline for uterine relaxation and a 1L IVF bolus. A bedside ultrasound was performed which revealed single intrauterine pregnancy and breech presentation. There was noted to be adequate fluid.  Copious mineral oil was used for abdominal lubrication. Using manual pressure, the breech was manipulated in a forward roll fashion until a vertex presentation was obtained. Fetal heart tones were checked intermittently during the procedure and were noted to be reassuring. Following successful external cephalic version, the patient was placed on continuous external fetal monitoring. She was noted to have a reassuring and reactive tracing for 1 hour following the external cephalic version. She did not have regular contractions and therefore she was felt to be stable for discharge to home. She was given appropriate labor  instructions. An abdominal binder was placed.    Complications: None   Hildred Laser, MD [facility]

## 2020-06-26 ENCOUNTER — Telehealth: Payer: Self-pay | Admitting: *Deleted

## 2020-06-26 LAB — RPR: RPR Ser Ql: NONREACTIVE

## 2020-06-26 NOTE — Telephone Encounter (Signed)
Transition Care Management Follow-up Telephone Call  Date of discharge and from where: 06/25/2020 [facility] L&D  How have you been since you were released from the hospital? "I am good"  Any questions or concerns? No  Items Reviewed:  Did the pt receive and understand the discharge instructions provided? Yes   Medications obtained and verified? N/A  Other? No   Any new allergies since your discharge? No   Dietary orders reviewed? No  Do you have support at home? Yes    Functional Questionnaire: (I = Independent and D = Dependent) ADLs: I  Bathing/Dressing- I  Meal Prep- I  Eating- I  Maintaining continence- I  Transferring/Ambulation- I  Managing Meds- I  Follow up appointments reviewed:   PCP Hospital f/u appt confirmed? No   Specialist Hospital f/u appt confirmed? Yes  Scheduled to see OBGYN on 07/01/2020 @ 1515.  Are transportation arrangements needed? No   If their condition worsens, is the pt aware to call PCP or go to the Emergency Dept.? Yes  Was the patient provided with contact information for the PCP's office or ED? Yes  Was to pt encouraged to call back with questions or concerns? Yes

## 2020-06-27 ENCOUNTER — Encounter: Payer: Self-pay | Admitting: Obstetrics and Gynecology

## 2020-06-27 ENCOUNTER — Other Ambulatory Visit: Payer: Self-pay

## 2020-06-27 ENCOUNTER — Observation Stay
Admission: EM | Admit: 2020-06-27 | Discharge: 2020-06-27 | Disposition: A | Discharge status: Home / Self Care | Payer: Medicaid Other | Attending: Certified Nurse Midwife | Admitting: Certified Nurse Midwife

## 2020-06-27 ENCOUNTER — Ambulatory Visit (INDEPENDENT_AMBULATORY_CARE_PROVIDER_SITE_OTHER): Payer: Medicaid Other | Admitting: Obstetrics and Gynecology

## 2020-06-27 VITALS — BP 127/79 | HR 82 | Wt 256.2 lb

## 2020-06-27 DIAGNOSIS — Z3403 Encounter for supervision of normal first pregnancy, third trimester: Secondary | ICD-10-CM

## 2020-06-27 DIAGNOSIS — Z3A38 38 weeks gestation of pregnancy: Secondary | ICD-10-CM | POA: Insufficient documentation

## 2020-06-27 DIAGNOSIS — O4193X0 Disorder of amniotic fluid and membranes, unspecified, third trimester, not applicable or unspecified: Principal | ICD-10-CM | POA: Insufficient documentation

## 2020-06-27 DIAGNOSIS — Z7982 Long term (current) use of aspirin: Secondary | ICD-10-CM | POA: Diagnosis not present

## 2020-06-27 LAB — POCT URINALYSIS DIPSTICK
Glucose, UA: NEGATIVE
Nitrite, UA: NEGATIVE
Protein, UA: NEGATIVE
Spec Grav, UA: 1.02 (ref 1.010–1.025)
Urobilinogen, UA: 1 E.U./dL
pH, UA: 5 (ref 5.0–8.0)

## 2020-06-27 LAB — RUPTURE OF MEMBRANE (ROM)PLUS: Rom Plus: NEGATIVE

## 2020-06-27 NOTE — OB Triage Note (Signed)
Pt discharged in good condition with significant other. RN at bedside for discharge instructions, teach back method used. Pt instructed to come back with increase in ctx, vaginal bleeding, LOF, decreased fetal movement. Pt additionally instructed to contact [facility] in the morning to get in to be scanned to confirm head down. Pt verbalized understanding.

## 2020-06-27 NOTE — Progress Notes (Signed)
Patient in L&D through the night for concern regarding SROM.  ROM ruled out.  She also was concerned that the baby had a very big movement and possibly flipped from vertex back to breech. Ultrasound performed- vertex position confirmed.  Patient reassured.

## 2020-06-27 NOTE — OB Triage Note (Signed)
    L&D OB Triage Note  SUBJECTIVE Tasha Macias is a 35 y.o. [id number] female at [redacted]w[redacted]d, EDD Estimated Date of Delivery: 07/08/20 who presented to triage with complaints of leaking of fluid. She denies contractions and feels good fetal movement. Pt state she felt big movement and is concerned that baby may have flipped.    OB History  Gravida Para Term Preterm AB Living  7 4 4  0 2 4  SAB IAB Ectopic Multiple Live Births  1 1 0 0 4    # Outcome Date GA Lbr Len/2nd Weight Sex Delivery Anes PTL Lv  7 Current           6 IAB 02/2019          5 Term 08/29/18 [redacted]w[redacted]d 07:04 / 00:33 3330 g F Vag-Spont EPI  LIV     Name: CALANDRA, MADURA     Apgar1: 8  Apgar5: 9  4 Term 05/26/11    F Vag-Spont   LIV  3 Term 03/08/10    M Vag-Spont   LIV  2 Term 08/17/07    M Vag-Spont   LIV  1 SAB 2008            Medications Prior to Admission  Medication Sig Dispense Refill Last Dose  . aspirin EC 81 MG tablet Take 1 tablet (81 mg total) by mouth daily. Swallow whole. 30 tablet 11 Past Month at Unknown time  . Iron-FA-B Cmp-C-Biot-Probiotic (FUSION PLUS) CAPS Take 1 tablet by mouth daily. 30 capsule 6 Past Month at Unknown time  . Prenatal Vit-Fe Fumarate-FA (PRENATAL MULTIVITAMIN) TABS tablet Take 1 tablet by mouth daily at 12 noon.   Past Week at Unknown time  . sertraline (ZOLOFT) 50 MG tablet Take 1 tablet (50 mg total) by mouth daily. 30 tablet 3 Past Month at Unknown time     OBJECTIVE  Nursing Evaluation:   BP 109/61 (BP Location: Left Arm)   Pulse 90   Temp 98 F (36.7 C) (Oral)   Resp 18   LMP 10/03/2019 (Exact Date)    Findings:       Reactive NST, intact      NST was performed and has been reviewed by me.  NST INTERPRETATION: Category I  Mode: External Baseline Rate (A): 125 bpm Variability: Moderate Accelerations: 15 x 15 Decelerations: None     Contraction Frequency (min): none  ASSESSMENT Impression:  1.  Pregnancy:  10/05/2019 at [redacted]w[redacted]d , EDD Estimated Date of Delivery:  07/08/20 2.  Reassuring fetal and maternal status 3.  intact  PLAN 1. Discussed current condition and above findings with patient and reassurance given.  All questions answered. Pt instructed to call office tomorrow to see if She can be scanned to see if baby has flipped to breech position.  2. Discharge home with standard labor precautions given to return to L&D or call the office for problems. 3. Continue routine prenatal care.  07/10/20, CNM

## 2020-06-27 NOTE — OB Triage Note (Signed)
Pt G7P4 [redacted]w[redacted]d presents [facility] ED w/ c/o LOF and pelvic pressure. States she thinks her water broke at around 10pm with large gush of fluid and states it has been leaking. Reports positive fetal movement, no vag bleeding, and possible ctx. Pt states she was here a couple days ago for version and is unsure if baby has flipped since.

## 2020-06-29 NOTE — Discharge Summary (Signed)
Obstetric Discharge Summary Reason for Admission: observation/evaluation and External Cephalic Version procedure Prenatal Procedures: NST (pre and post procedure) Complications: none    Hemoglobin  Date Value Ref Range Status  06/25/2020 10.5 (L) 12.0 - 15.0 g/dL Final  28/76/8115 72.6 (L) 11.1 - 15.9 g/dL Final   HCT  Date Value Ref Range Status  06/25/2020 33.6 (L) 36.0 - 46.0 % Final   Hematocrit  Date Value Ref Range Status  05/01/2020 32.2 (L) 34.0 - 46.6 % Final    NONSTRESS TEST INTERPRETATION  INDICATIONS: Breech presentation, for external cephalic version  FHR baseline: 145 bpm (pre-procedure), 155 bpm (post-procedure RESULTS:Reactive COMMENTS: Uterine irritability present, no contractions   PLAN: 1. Continue fetal kick counts twice a day. 2. Abdominal binding.  3. Scheduled for IOL at 40 weeks for Obesity in pregnancy (BMI 43)    Physical Exam:  Blood pressure 120/85, pulse (!) 109, temperature 98.3 F (36.8 C), temperature source Oral, resp. rate 20, last menstrual period 10/03/2019, currently breastfeeding.  General: alert and no distress Uterine Fundus: gravid Pelvis: deferred  Discharge Diagnoses: Vertex presentation, s/p external cephalic version  Discharge Information: Date: 06/25/2020 Activity: unrestricted Diet: routine Condition: stable Instructions:  Abdominal binding daily. Fetal kick counts twice daily.  Discharge to: home    Hildred Laser 06/25/2020, 9:33 PM

## 2020-07-01 ENCOUNTER — Ambulatory Visit (INDEPENDENT_AMBULATORY_CARE_PROVIDER_SITE_OTHER): Payer: Medicaid Other | Admitting: Certified Nurse Midwife

## 2020-07-01 ENCOUNTER — Other Ambulatory Visit: Payer: Self-pay

## 2020-07-01 ENCOUNTER — Encounter: Payer: Self-pay | Admitting: Certified Nurse Midwife

## 2020-07-01 VITALS — BP 124/82 | HR 94 | Wt 253.1 lb

## 2020-07-01 DIAGNOSIS — Z3403 Encounter for supervision of normal first pregnancy, third trimester: Secondary | ICD-10-CM

## 2020-07-01 DIAGNOSIS — Z3A39 39 weeks gestation of pregnancy: Secondary | ICD-10-CM

## 2020-07-01 LAB — POCT URINALYSIS DIPSTICK OB
Bilirubin, UA: NEGATIVE
Blood, UA: POSITIVE
Glucose, UA: NEGATIVE
Ketones, UA: NEGATIVE
Nitrite, UA: NEGATIVE
Spec Grav, UA: 1.015 (ref 1.010–1.025)
Urobilinogen, UA: 0.2 E.U./dL
pH, UA: 7 (ref 5.0–8.0)

## 2020-07-01 NOTE — Progress Notes (Signed)
ROB: Feels very tired. She has lots of pain in her left inner thigh.

## 2020-07-01 NOTE — Patient Instructions (Signed)

## 2020-07-01 NOTE — Progress Notes (Signed)
Body mass index is 43.44 kg/m. Discussed induction due to elevated BMI , pt state she has scheduled already. Labor precautions reviewed. She feels good fetal movement. Declines SVE today. Thayer Ohm suggest baby remains in vertex position. Follow up as scheduled.    Doreene Burke, CNM

## 2020-07-07 ENCOUNTER — Other Ambulatory Visit: Payer: Self-pay

## 2020-07-07 ENCOUNTER — Encounter: Payer: Self-pay | Admitting: Certified Nurse Midwife

## 2020-07-07 ENCOUNTER — Inpatient Hospital Stay
Admission: RE | Admit: 2020-07-07 | Discharge: 2020-07-09 | DRG: 807 | Disposition: A | Discharge status: Home / Self Care | Payer: Medicaid Other | Attending: Certified Nurse Midwife | Admitting: Certified Nurse Midwife

## 2020-07-07 DIAGNOSIS — Z20822 Contact with and (suspected) exposure to covid-19: Secondary | ICD-10-CM | POA: Diagnosis present

## 2020-07-07 DIAGNOSIS — Z3A40 40 weeks gestation of pregnancy: Secondary | ICD-10-CM

## 2020-07-07 DIAGNOSIS — Z3A39 39 weeks gestation of pregnancy: Secondary | ICD-10-CM

## 2020-07-07 DIAGNOSIS — Z87891 Personal history of nicotine dependence: Secondary | ICD-10-CM

## 2020-07-07 DIAGNOSIS — O99214 Obesity complicating childbirth: Secondary | ICD-10-CM | POA: Diagnosis not present

## 2020-07-07 DIAGNOSIS — O26893 Other specified pregnancy related conditions, third trimester: Secondary | ICD-10-CM | POA: Diagnosis not present

## 2020-07-07 DIAGNOSIS — Z3490 Encounter for supervision of normal pregnancy, unspecified, unspecified trimester: Secondary | ICD-10-CM | POA: Diagnosis present

## 2020-07-07 LAB — CBC
HCT: 32.1 % — ABNORMAL LOW (ref 36.0–46.0)
Hemoglobin: 10 g/dL — ABNORMAL LOW (ref 12.0–15.0)
MCH: 25.7 pg — ABNORMAL LOW (ref 26.0–34.0)
MCHC: 31.2 g/dL (ref 30.0–36.0)
MCV: 82.5 fL (ref 80.0–100.0)
Platelets: 235 10*3/uL (ref 150–400)
RBC: 3.89 MIL/uL (ref 3.87–5.11)
RDW: 16.6 % — ABNORMAL HIGH (ref 11.5–15.5)
WBC: 9 10*3/uL (ref 4.0–10.5)
nRBC: 0.3 % — ABNORMAL HIGH (ref 0.0–0.2)

## 2020-07-07 LAB — RAPID HIV SCREEN (HIV 1/2 AB+AG)
HIV 1/2 Antibodies: NONREACTIVE
HIV-1 P24 Antigen - HIV24: NONREACTIVE

## 2020-07-07 LAB — RESP PANEL BY RT-PCR (FLU A&B, COVID) ARPGX2
Influenza A by PCR: NEGATIVE
Influenza B by PCR: NEGATIVE
SARS Coronavirus 2 by RT PCR: NEGATIVE

## 2020-07-07 LAB — TYPE AND SCREEN
ABO/RH(D): A POS
Antibody Screen: NEGATIVE

## 2020-07-07 MED ORDER — MISOPROSTOL 200 MCG PO TABS
ORAL_TABLET | ORAL | Status: AC
  Administered 2020-07-07: 50 ug via BUCCAL
  Filled 2020-07-07: qty 4

## 2020-07-07 MED ORDER — OXYTOCIN-SODIUM CHLORIDE 30-0.9 UT/500ML-% IV SOLN
1.0000 m[IU]/min | INTRAVENOUS | Status: DC
  Administered 2020-07-07: 2 m[IU]/min via INTRAVENOUS
  Filled 2020-07-07: qty 500

## 2020-07-07 MED ORDER — LIDOCAINE HCL (PF) 1 % IJ SOLN
30.0000 mL | INTRAMUSCULAR | Status: DC | PRN
  Filled 2020-07-07: qty 30

## 2020-07-07 MED ORDER — BUTORPHANOL TARTRATE 1 MG/ML IJ SOLN: 1.0000 mg | INTRAMUSCULAR | Status: DC | PRN

## 2020-07-07 MED ORDER — SOD CITRATE-CITRIC ACID 500-334 MG/5ML PO SOLN: 30.0000 mL | ORAL | Status: DC | PRN

## 2020-07-07 MED ORDER — AMMONIA AROMATIC IN INHA
RESPIRATORY_TRACT | Status: AC
  Filled 2020-07-07: qty 10

## 2020-07-07 MED ORDER — LACTATED RINGERS IV SOLN
500.0000 mL | Freq: Once | INTRAVENOUS | Status: AC
  Administered 2020-07-08: 500 mL via INTRAVENOUS

## 2020-07-07 MED ORDER — OXYCODONE-ACETAMINOPHEN 5-325 MG PO TABS: 1.0000 | ORAL_TABLET | ORAL | Status: DC | PRN

## 2020-07-07 MED ORDER — FENTANYL CITRATE (PF) 100 MCG/2ML IJ SOLN
50.0000 ug | INTRAMUSCULAR | Status: DC | PRN
  Administered 2020-07-07 – 2020-07-08 (×2): 100 ug via INTRAVENOUS
  Filled 2020-07-07 (×2): qty 2

## 2020-07-07 MED ORDER — EPHEDRINE 5 MG/ML INJ: 10.0000 mg | INTRAVENOUS | Status: DC | PRN

## 2020-07-07 MED ORDER — OXYTOCIN BOLUS FROM INFUSION
333.0000 mL | Freq: Once | INTRAVENOUS | Status: AC
  Administered 2020-07-08: 333 mL via INTRAVENOUS

## 2020-07-07 MED ORDER — DIPHENHYDRAMINE HCL 50 MG/ML IJ SOLN: 12.5000 mg | INTRAMUSCULAR | Status: DC | PRN

## 2020-07-07 MED ORDER — ACETAMINOPHEN 325 MG PO TABS: 650.0000 mg | ORAL_TABLET | ORAL | Status: DC | PRN

## 2020-07-07 MED ORDER — FENTANYL 2.5 MCG/ML W/ROPIVACAINE 0.15% IN NS 100 ML EPIDURAL (ARMC)
12.0000 mL/h | EPIDURAL | Status: DC
  Administered 2020-07-08: 12 mL/h via EPIDURAL
  Filled 2020-07-07: qty 100

## 2020-07-07 MED ORDER — TERBUTALINE SULFATE 1 MG/ML IJ SOLN: 0.2500 mg | Freq: Once | INTRAMUSCULAR | Status: DC | PRN

## 2020-07-07 MED ORDER — MISOPROSTOL 50MCG HALF TABLET
50.0000 ug | ORAL_TABLET | ORAL | Status: DC
  Filled 2020-07-07: qty 1

## 2020-07-07 MED ORDER — OXYTOCIN 10 UNIT/ML IJ SOLN
INTRAMUSCULAR | Status: AC
  Filled 2020-07-07: qty 2

## 2020-07-07 MED ORDER — OXYTOCIN-SODIUM CHLORIDE 30-0.9 UT/500ML-% IV SOLN
2.5000 [IU]/h | INTRAVENOUS | Status: DC
  Filled 2020-07-07: qty 500

## 2020-07-07 MED ORDER — PHENYLEPHRINE 40 MCG/ML (10ML) SYRINGE FOR IV PUSH (FOR BLOOD PRESSURE SUPPORT): 80.0000 ug | PREFILLED_SYRINGE | INTRAVENOUS | Status: DC | PRN

## 2020-07-07 MED ORDER — ZOLPIDEM TARTRATE 5 MG PO TABS: 5.0000 mg | ORAL_TABLET | Freq: Every evening | ORAL | Status: DC | PRN

## 2020-07-07 MED ORDER — ONDANSETRON HCL 4 MG/2ML IJ SOLN
4.0000 mg | Freq: Four times a day (QID) | INTRAMUSCULAR | Status: DC | PRN
Start: 2020-07-07 — End: 2020-07-08

## 2020-07-07 MED ORDER — LACTATED RINGERS IV SOLN
500.0000 mL | INTRAVENOUS | Status: DC | PRN
  Administered 2020-07-08: 500 mL via INTRAVENOUS

## 2020-07-07 MED ORDER — OXYCODONE-ACETAMINOPHEN 5-325 MG PO TABS: 2.0000 | ORAL_TABLET | ORAL | Status: DC | PRN

## 2020-07-07 MED ORDER — LACTATED RINGERS IV SOLN: INTRAVENOUS | Status: DC

## 2020-07-07 MED ORDER — MISOPROSTOL 50MCG HALF TABLET: 50.0000 ug | ORAL_TABLET | ORAL | Status: DC

## 2020-07-07 NOTE — Progress Notes (Signed)
Patient ID: Tasha Macias, female   DOB: Dec 25, 1985, 35 y.o.   MRN: [id number]  Tasha Macias is a 35 y.o. [id number] at [redacted]w[redacted]d by LMP admitted for unstable lie and obesity in pregnancy  Subjective:  Patient sitting in bed, breathing through contractions. FOB at bedside for continuous labor support.   Denies difficulty breathing or respiratory distress, chest pain, dysuria, and leg pain.   Objective:  Temp:  [98.1 F (36.7 C)-98.2 F (36.8 C)] 98.2 F (36.8 C) (05/30 1723) Pulse Rate:  [84-103] 84 (05/30 1723) Resp:  [16] 16 (05/30 1723) BP: (90-110)/(61-77) 110/65 (05/30 1723) Weight:  [112.5 kg] 112.5 kg (05/30 1111)  Fetal Wellbeing:  Category I and Category II  UC:   irregular, every three (3) to six (6) minutes; soft resting tone  SVE:   Dilation: 2 Effacement (%): 50 Station: -2 Exam by:: Zayli Villafuerte CNM  Labs: Lab Results  Component Value Date   WBC 9.0 07/07/2020   HGB 10.0 (L) 07/07/2020   HCT 32.1 (L) 07/07/2020   MCV 82.5 07/07/2020   PLT 235 07/07/2020    Assessment:  Tasha Macias is a 35 y.o. [id number] at [redacted]w[redacted]d being admitted for induction of labor, Rh positive, GBS negative  FHR Category I  Plan:  Cook's catheter placed using stylette after visualizing cervix with plastic speculum, 80/80.   Start pitocin per protocol, see orders.   Reviewed red flag symptoms and when to call.   Dr. Valentino Saxon notified and plan of care update given.    Tasha Macias, CNM [facility], [facility] 07/07/2020, 5:50 PM

## 2020-07-07 NOTE — H&P (Signed)
Obstetric History and Physical  Tasha Macias is a 35 y.o. [phone] with IUP at [redacted]w[redacted]d presenting for induction of labor due to unstable fetal lie and elevated BMI.   Patient states she has been having  irregular contractions, none vaginal bleeding, intact membranes, with active fetal movement.    Denies difficulty breathing or respiratory distress, chest pain, abdominal pain, vaginal bleeding, dysuria, and leg pain or swelling.   Prenatal Course  Source of Care: [facility]-initial visit at 16 weeks, total: 11  Pregnancy complications or risks: Late entry prenatal care, Anemia, History of pre-eclampsia, Obesity in pregnancy  Prenatal labs and studies:  ABO, Rh: --/--/A POS (05/18 1134)  Antibody: NEG (05/18 1134)  Rubella: 3.91 (12/15 1043)  Varicella: 653 (12/15 1043)  RPR: NON REACTIVE (05/18 1134)   HBsAg: Negative (12/15 1043)   HIV: Non Reactive (12/15 1043)   ELF:YBOFBPZW/-- (05/10 1525)  1 hr Glucola: 106 (03/24 1508)  Genetic screening: Low risk female (12/15 0904)  Anatomy US: Complete, normal (01/26 1126)  Past Medical History:  Diagnosis Date  . Preeclampsia 2009    Past Surgical History:  Procedure Laterality Date  . DILATION AND CURETTAGE OF UTERUS      OB History  Gravida Para Term Preterm AB Living  7 4 4  0 2 4  SAB IAB Ectopic Multiple Live Births  1 1 0 0 4    # Outcome Date GA Lbr Len/2nd Weight Sex Delivery Anes PTL Lv  7 Current           6 IAB 02/2019          5 Term 08/29/18 [redacted]w[redacted]d 07:04 / 00:33 3330 g F Vag-Spont EPI  LIV  4 Term 05/26/11    F Vag-Spont   LIV  3 Term 03/08/10    M Vag-Spont   LIV  2 Term 08/17/07    M Vag-Spont   LIV  1 SAB 2008            Social History   Socioeconomic History  . Marital status: Significant Other    Spouse name: Jermaeie   . Number of children: Not on file  . Years of education: Not on file  . Highest education level: Not on file  Occupational History  . Not on file  Tobacco Use  . Smoking  status: Former Smoker    Packs/day: 0.50    Types: Cigarettes  . Smokeless tobacco: Never Used  Vaping Use  . Vaping Use: Never used  Substance and Sexual Activity  . Alcohol use: Not Currently    Alcohol/week: 2.0 standard drinks    Types: 2 Standard drinks or equivalent per week    Comment: last use 12/10/19  . Drug use: Not Currently  . Sexual activity: Not Currently    Birth control/protection: None  Other Topics Concern  . Not on file  Social History Narrative  . Not on file   Social Determinants of Health   Financial Resource Strain: Not on file  Food Insecurity: Not on file  Transportation Needs: Not on file  Physical Activity: Not on file  Stress: Not on file  Social Connections: Not on file    Family History  Problem Relation Age of Onset  . Autism Son   . Hypertension Maternal Grandmother   . Breast cancer Neg Hx   . Ovarian cancer Neg Hx   . Colon cancer Neg Hx     Medications Prior to Admission  Medication Sig Dispense Refill Last Dose  .  Prenatal Vit-Fe Fumarate-FA (PRENATAL MULTIVITAMIN) TABS tablet Take 1 tablet by mouth daily at 12 noon.   Past Week at Unknown time  . aspirin EC 81 MG tablet Take 1 tablet (81 mg total) by mouth daily. Swallow whole. 30 tablet 11   . Iron-FA-B Cmp-C-Biot-Probiotic (FUSION PLUS) CAPS Take 1 tablet by mouth daily. 30 capsule 6   . sertraline (ZOLOFT) 50 MG tablet Take 1 tablet (50 mg total) by mouth daily. 30 tablet 3     No Known Allergies  Review of Systems: Negative except for what is mentioned in HPI.  Physical Exam:  Temp:  [98.2 F (36.8 C)] 98.2 F (36.8 C) (05/30 0946) Pulse Rate:  [89] 89 (05/30 0946) Resp:  [16] 16 (05/30 0946) BP: (101)/(77) 101/77 (05/30 0946)  GENERAL: Well-developed, well-nourished female in no acute distress.   LUNGS: Clear to auscultation bilaterally.   HEART: Regular rate and rhythm.  ABDOMEN: Soft, nontender, nondistended, gravid.  EXTREMITIES: Nontender, no edema, 2+  distal pulses.  Cervical Exam: Dilation: 1 Effacement (%): Thick Cervical Position: Posterior Station: -3 Presentation: Vertex Exam by:: Willodean Rosenthal, CNM  FHR Category I  Contractions: Irregular, soft resting tone  Pertinent Labs/Studies:   Results for orders placed or performed during the hospital encounter of 07/07/20 (from the past 24 hour(s))  CBC     Status: Abnormal   Collection Time: 07/07/20 10:07 AM  Result Value Ref Range   WBC 9.0 4.0 - 10.5 K/uL   RBC 3.89 3.87 - 5.11 MIL/uL   Hemoglobin 10.0 (L) 12.0 - 15.0 g/dL   HCT 41.6 (L) 60.6 - 30.1 %   MCV 82.5 80.0 - 100.0 fL   MCH 25.7 (L) 26.0 - 34.0 pg   MCHC 31.2 30.0 - 36.0 g/dL   RDW 60.1 (H) 09.3 - 23.5 %   Platelets 235 150 - 400 K/uL   nRBC 0.3 (H) 0.0 - 0.2 %    Assessment :  Tasha Macias is a 35 y.o. [id number] at [redacted]w[redacted]d being admitted for induction of labor, Rh positive, GBS negative  FHR Category I  Plan:  Admit to birthing suites, see orders.   Induction/Augmentation as needed, per protocol.   Hopeful for vaginal birth.   Dr. Valentino Saxon notified of admission and plan of care.    Gunnar Bulla, CNM [facility], [facility] 07/07/20 11:02 AM

## 2020-07-07 NOTE — Anesthesia Preprocedure Evaluation (Addendum)
Anesthesia Evaluation  Patient identified by MRN, date of birth, ID band Patient awake    Reviewed: Allergy & Precautions, NPO status , Patient's Chart, lab work & pertinent test results  History of Anesthesia Complications Negative for: history of anesthetic complications  Airway Mallampati: II  TM Distance: >3 FB Neck ROM: Full    Dental no notable dental hx. (+) Teeth Intact   Pulmonary neg pulmonary ROS, neg sleep apnea, neg COPD, Patient abstained from smoking.Not current smoker, former smoker,    Pulmonary exam normal breath sounds clear to auscultation       Cardiovascular Exercise Tolerance: Good METShypertension, (-) CAD and (-) Past MI (-) dysrhythmias  Rhythm:Regular Rate:Normal - Systolic murmurs Hx preeclampsia   Neuro/Psych PSYCHIATRIC DISORDERS Depression negative neurological ROS     GI/Hepatic neg GERD  ,(+)     (-) substance abuse  ,   Endo/Other  neg diabetesMorbid obesity  Renal/GU negative Renal ROS     Musculoskeletal   Abdominal   Peds  Hematology   Anesthesia Other Findings Past Medical History: 2009: Preeclampsia  Reproductive/Obstetrics (+) Pregnancy                            Anesthesia Physical Anesthesia Plan  ASA: III  Anesthesia Plan: Epidural   Post-op Pain Management:    Induction:   PONV Risk Score and Plan: 2 and Treatment may vary due to age or medical condition and Ondansetron  Airway Management Planned: Natural Airway  Additional Equipment:   Intra-op Plan:   Post-operative Plan:   Informed Consent: I have reviewed the patients History and Physical, chart, labs and discussed the procedure including the risks, benefits and alternatives for the proposed anesthesia with the patient or authorized representative who has indicated his/her understanding and acceptance.       Plan Discussed with: Surgeon  Anesthesia Plan Comments:  (Discussed R/B/A of neuraxial anesthesia technique with patient: - rare risks of spinal/epidural hematoma, nerve damage, infection - Risk of PDPH - Risk of itching - Risk of nausea and vomiting - Risk of poor block necessitating replacement of epidural. Patient voiced understanding.)        Anesthesia Quick Evaluation

## 2020-07-07 NOTE — Progress Notes (Signed)
Patient ID: Tasha Macias, female   DOB: Jul 01, 1985, 35 y.o.   MRN: [id number]  Tasha Macias is a 35 y.o. X3G1829 at [redacted]w[redacted]d by LMP admitted for induction of labor due to unstable lie and obesity in pregnancy.  Subjective:  Patient cuddling in bed with FOB, breathing through contractions.   Denies difficulty breathing or respiratory distress, chest pain, dysuria, and leg pain.   Objective:  Temp:  [98.1 F (36.7 C)-98.2 F (36.8 C)] 98.1 F (36.7 C) (05/30 1858) Pulse Rate:  [77-103] 87 (05/30 2202) Resp:  [16] 16 (05/30 1723) BP: (90-122)/(61-77) 111/64 (05/30 2202) Weight:  [112.5 kg] 112.5 kg (05/30 1111)  Fetal Wellbeing:  Category I  UC:   regular, every two (2) to four (4) minutes; soft resting tone, pitocin infusing at 10 mu/min  SVE:   Dilation: 6 Effacement (%): 60 Station: -2 Exam by:: Moncia Annas CNM  Labs: Lab Results  Component Value Date   WBC 9.0 07/07/2020   HGB 10.0 (L) 07/07/2020   HCT 32.1 (L) 07/07/2020   MCV 82.5 07/07/2020   PLT 235 07/07/2020    Assessment:  Tasha D Streetis a 35 y.H.[id number] at [redacted]w[redacted]d admitted for induction oflabor, Rh positive, GBS negative  FHR Category I  Plan:  Encouraged position change and ambulation to promote descent of fetal head, patient and FOB agree with plan of care.   Continue orders as written.   Update given to Dr. Valentino Saxon.    Tasha Macias, CNM [facility], [facility] 07/07/2020, 10:40 PM

## 2020-07-08 ENCOUNTER — Inpatient Hospital Stay: Payer: Medicaid Other | Admitting: Anesthesiology

## 2020-07-08 ENCOUNTER — Encounter: Payer: Self-pay | Admitting: Certified Nurse Midwife

## 2020-07-08 DIAGNOSIS — Z87891 Personal history of nicotine dependence: Secondary | ICD-10-CM | POA: Diagnosis not present

## 2020-07-08 DIAGNOSIS — Z3A40 40 weeks gestation of pregnancy: Secondary | ICD-10-CM

## 2020-07-08 LAB — RPR: RPR Ser Ql: NONREACTIVE

## 2020-07-08 LAB — HEPATITIS B SURFACE ANTIBODY, QUANTITATIVE: Hep B S AB Quant (Post): 487.6 m[IU]/mL (ref >9.9)

## 2020-07-08 MED ORDER — FERROUS SULFATE 325 (65 FE) MG PO TABS
325.0000 mg | ORAL_TABLET | Freq: Two times a day (BID) | ORAL | Status: DC
  Administered 2020-07-08 – 2020-07-09 (×2): 325 mg via ORAL
  Filled 2020-07-08 (×2): qty 1

## 2020-07-08 MED ORDER — ONDANSETRON HCL 4 MG/2ML IJ SOLN: 4.0000 mg | INTRAMUSCULAR | Status: DC | PRN

## 2020-07-08 MED ORDER — PRENATAL MULTIVITAMIN CH
1.0000 | ORAL_TABLET | Freq: Every day | ORAL | Status: DC
  Administered 2020-07-09: 1 via ORAL
  Filled 2020-07-08: qty 1

## 2020-07-08 MED ORDER — WITCH HAZEL-GLYCERIN EX PADS: 1.0000 "application " | MEDICATED_PAD | CUTANEOUS | Status: DC | PRN

## 2020-07-08 MED ORDER — METHYLERGONOVINE MALEATE 0.2 MG/ML IJ SOLN
0.2000 mg | INTRAMUSCULAR | Status: DC | PRN
Start: 2020-07-08 — End: 2020-07-09

## 2020-07-08 MED ORDER — DIPHENHYDRAMINE HCL 25 MG PO CAPS: 25.0000 mg | ORAL_CAPSULE | Freq: Four times a day (QID) | ORAL | Status: DC | PRN

## 2020-07-08 MED ORDER — SIMETHICONE 80 MG PO CHEW: 80.0000 mg | CHEWABLE_TABLET | ORAL | Status: DC | PRN

## 2020-07-08 MED ORDER — SENNOSIDES-DOCUSATE SODIUM 8.6-50 MG PO TABS
2.0000 | ORAL_TABLET | ORAL | Status: DC
  Administered 2020-07-08: 2 via ORAL
  Filled 2020-07-08: qty 2

## 2020-07-08 MED ORDER — METHYLERGONOVINE MALEATE 0.2 MG PO TABS: 0.2000 mg | ORAL_TABLET | ORAL | Status: DC | PRN

## 2020-07-08 MED ORDER — LIDOCAINE HCL (PF) 1 % IJ SOLN
INTRAMUSCULAR | Status: DC | PRN
  Administered 2020-07-08: 3 mL

## 2020-07-08 MED ORDER — BUPIVACAINE HCL (PF) 0.25 % IJ SOLN
INTRAMUSCULAR | Status: DC | PRN
  Administered 2020-07-08 (×2): 4 mL via EPIDURAL
  Administered 2020-07-08: 3 mL via EPIDURAL

## 2020-07-08 MED ORDER — LIDOCAINE-EPINEPHRINE (PF) 1.5 %-1:200000 IJ SOLN
INTRAMUSCULAR | Status: DC | PRN
  Administered 2020-07-08 (×2): 3 mL via PERINEURAL

## 2020-07-08 MED ORDER — BENZOCAINE-MENTHOL 20-0.5 % EX AERO
1.0000 "application " | INHALATION_SPRAY | CUTANEOUS | Status: DC | PRN
  Filled 2020-07-08: qty 56

## 2020-07-08 MED ORDER — COCONUT OIL OIL: 1.0000 "application " | TOPICAL_OIL | Status: DC | PRN

## 2020-07-08 MED ORDER — IBUPROFEN 600 MG PO TABS
600.0000 mg | ORAL_TABLET | Freq: Four times a day (QID) | ORAL | Status: DC
  Administered 2020-07-08 – 2020-07-09 (×5): 600 mg via ORAL
  Filled 2020-07-08 (×5): qty 1

## 2020-07-08 MED ORDER — SODIUM CHLORIDE 0.9 % IV SOLN: 250.0000 mL | INTRAVENOUS | Status: DC | PRN

## 2020-07-08 MED ORDER — ACETAMINOPHEN 325 MG PO TABS: 650.0000 mg | ORAL_TABLET | ORAL | Status: DC | PRN

## 2020-07-08 MED ORDER — ONDANSETRON HCL 4 MG PO TABS: 4.0000 mg | ORAL_TABLET | ORAL | Status: DC | PRN

## 2020-07-08 MED ORDER — SODIUM CHLORIDE 0.9% FLUSH: 3.0000 mL | INTRAVENOUS | Status: DC | PRN

## 2020-07-08 MED ORDER — SODIUM CHLORIDE 0.9% FLUSH: 3.0000 mL | Freq: Two times a day (BID) | INTRAVENOUS | Status: DC

## 2020-07-08 MED ORDER — DIBUCAINE (PERIANAL) 1 % EX OINT: 1.0000 "application " | TOPICAL_OINTMENT | CUTANEOUS | Status: DC | PRN

## 2020-07-08 NOTE — Progress Notes (Signed)
Patient ID: Paiden Caraveo Pandey, female   DOB: 09-Nov-1985, 35 y.o.   MRN: [id number]  Mariavictoria Nottingham Bertram is a 35 y.o. [id number] at [redacted]w[redacted]d by LMP admitted for induction of labor due to unstable fetal lie and obesity in pregnancy.  Subjective:  Patient crying in bed, rocking from side to side with contractions. Reports continued unrelieved contraction pain since epidural placement.   FOB at bedside for continuous labor support.   Denies difficulty breathing or respiratory distress, chest pain, dysuria, and leg pain.   Objective:  Temp:  [98 F (36.7 C)-98.6 F (37 C)] 98.6 F (37 C) (05/31 0715) Pulse Rate:  [77-105] 91 (05/31 0919) Resp:  [16] 16 (05/30 1723) BP: (90-131)/(29-97) 102/47 (05/31 0919) SpO2:  [97 %-99 %] 97 % (05/31 0916) Weight:  [112.5 kg] 112.5 kg (05/30 1111)  Fetal Wellbeing:  Category I  UC:   regular, every two (2) to three (3) minutes; soft resting tone, pitocin infusing at 30 mu/min  SVE:   Dilation: 6 Effacement (%): 70 Station: -2 Exam by:: J Lunsford RN SROM: clear, small amount  Labs: Lab Results  Component Value Date   WBC 9.0 07/07/2020   HGB 10.0 (L) 07/07/2020   HCT 32.1 (L) 07/07/2020   MCV 82.5 07/07/2020   PLT 235 07/07/2020    Assessment:  Leina D Streetis a 31 y.S.N0N3976 at [redacted]w[redacted]d admitted for induction oflabor, Rh positive, GBS negative, SROM  FHR Category I  Plan:  CRNA at bedside to replace patient's epidural.   Will update plan of care once patient comfortable.    Serafina Royals, CNM [facility], [facility] 07/08/2020, 9:20 AM

## 2020-07-08 NOTE — Anesthesia Procedure Notes (Signed)
Epidural Patient location during procedure: OB Start time: 07/08/2020 7:49 AM End time: 07/08/2020 8:09 AM  Staffing Anesthesiologist: Naomie Dean, MD Resident/CRNA: Stormy Fabian, CRNA Performed: resident/CRNA   Preanesthetic Checklist Completed: patient identified, IV checked, site marked, risks and benefits discussed, surgical consent, monitors and equipment checked, pre-op evaluation and timeout performed  Epidural Patient position: sitting Prep: Betadine and ChloraPrep Patient monitoring: heart rate, continuous pulse ox and blood pressure Approach: midline Location: L4-L5 Injection technique: LOR air  Needle:  Needle type: Tuohy  Needle gauge: 18 G Needle length: 9 cm and 9 Needle insertion depth: 8 and 8 cm Catheter type: closed end flexible Catheter size: 20 Guage Catheter at skin depth: 13 cm Test dose: negative and 1.5% lidocaine with Epi 1:200 K  Assessment Sensory level: T10 Events: blood not aspirated, injection not painful, no injection resistance, no paresthesia and negative IV test  Additional Notes Pt's history reviewed and consent obtained as per OB consent Patient tolerated the insertion well without complications. Negative SATD, negative IVTD All VSS were obtained and monitored through OBIX and nursing protocols followed.Reason for block:procedure for pain

## 2020-07-08 NOTE — Progress Notes (Signed)
Patient ID: Tasha Macias, female   DOB: 07-Jun-1985, 35 y.o.   MRN: [id number]  Tasha Macias is a 35 y.o. U2G2542 at [redacted]w[redacted]d by LMP admitted for induction of labor due to unstable lie and obesity in pregnancy.  Subjective:  Patient sleeping in bed with eyes closed; recently received dose of IV fentanyl, see MAR.   FOB sleeping at bedside.   Denies difficulty breathing or respiratory distress, chest pain, abdominal pain, dysuria, and leg pain.   Objective:  Temp:  [98 F (36.7 C)-98.2 F (36.8 C)] 98 F (36.7 C) (05/31 0200) Pulse Rate:  [77-103] 81 (05/31 0335) Resp:  [16] 16 (05/30 1723) BP: (90-122)/(48-77) 96/48 (05/31 0335) Weight:  [112.5 kg] 112.5 kg (05/30 1111)  Fetal Wellbeing:  Category I  UC:   Irregular contractions; soft resting tone, pitocin infusing at 26 mu/min  SVE:   Dilation: 6 Effacement (%): 60 Station: -2 Exam by:: B Darnell RN  Labs: Lab Results  Component Value Date   WBC 9.0 07/07/2020   HGB 10.0 (L) 07/07/2020   HCT 32.1 (L) 07/07/2020   MCV 82.5 07/07/2020   PLT 235 07/07/2020    Assessment:  Tasha D Streetis a 40 y.H.[id number] at [redacted]w[redacted]d admitted for induction oflabor, Rh positive, GBS negative  FHR Category I  Plan:  Patient declines AROM at this time, wishes to rest.   Reviewed red flag symptoms and when to call.   Titrate pitocin as ordered, maximum dose 30 mu/min.    Update given to Dr. Valentino Saxon.    Tasha Macias, CNM [facility], [facility] 07/08/2020, 4:18 AM

## 2020-07-08 NOTE — Anesthesia Procedure Notes (Signed)
Epidural Patient location during procedure: OB Start time: 07/08/2020 9:00 AM  Staffing Performed: resident/CRNA   Preanesthetic Checklist Completed: patient identified, IV checked, site marked, risks and benefits discussed, surgical consent, monitors and equipment checked, pre-op evaluation and timeout performed  Epidural Patient position: sitting Prep: ChloraPrep Patient monitoring: heart rate and blood pressure Approach: midline Location: L3-L4 Injection technique: LOR air  Needle:  Needle type: Tuohy  Needle gauge: 17 G Needle length: 9 cm Needle insertion depth: 7.5 cm Catheter size: 19 Gauge Catheter at skin depth: 12 cm Test dose: negative and 1.5% lidocaine with Epi 1:200 K  Assessment Events: blood not aspirated, injection not painful, no injection resistance, no paresthesia and negative IV test  Additional Notes Patient states no pain relief from existing catheter.  Dc'd with tip intact.  New catheter placed with good relief.  Pt resting comfortably.

## 2020-07-09 MED ORDER — IBUPROFEN 600 MG PO TABS: 600.0000 mg | ORAL_TABLET | Freq: Four times a day (QID) | ORAL | 0 refills | Status: DC

## 2020-07-09 MED ORDER — ACETAMINOPHEN 500 MG PO TABS: 1000.0000 mg | ORAL_TABLET | Freq: Four times a day (QID) | ORAL | 0 refills | Status: DC | PRN

## 2020-07-09 MED ORDER — WITCH HAZEL-GLYCERIN EX PADS: 1.0000 "application " | MEDICATED_PAD | CUTANEOUS | 12 refills | Status: DC | PRN

## 2020-07-09 MED ORDER — BENZOCAINE-MENTHOL 20-0.5 % EX AERO: 1.0000 "application " | INHALATION_SPRAY | CUTANEOUS | 0 refills | Status: DC | PRN

## 2020-07-09 NOTE — Anesthesia Postprocedure Evaluation (Signed)
Anesthesia Post Note  Patient: Tasha Macias  Procedure(s) Performed: AN AD HOC LABOR EPIDURAL  Patient location during evaluation: Mother Baby Anesthesia Type: Epidural Level of consciousness: awake and alert Pain management: pain level controlled Vital Signs Assessment: post-procedure vital signs reviewed and stable Respiratory status: spontaneous breathing, nonlabored ventilation and respiratory function stable Cardiovascular status: stable Postop Assessment: no headache, no backache and epidural receding Anesthetic complications: no   No complications documented.   Last Vitals:  Vitals:   07/09/20 0335 07/09/20 0804  BP: 111/71 114/67  Pulse: 99 70  Resp: 18 20  Temp: 37.2 C 36.6 C  SpO2: 98% 100%    Last Pain:  Vitals:   07/09/20 0804  TempSrc: Oral  PainSc:                  Karoline Caldwell

## 2020-07-09 NOTE — Progress Notes (Signed)
Pt discharged with infant.  Discharge instructions, prescriptions and follow up appointment given to and reviewed with pt. Pt verbalized understanding. Escorted out by auxillary. 

## 2020-07-09 NOTE — Discharge Instructions (Signed)
Breastfeeding Tips for a Good Latch Latching is how your baby's mouth attaches to your nipple to breastfeed. It is an important part of breastfeeding. Your baby may have trouble latching for a number of reasons, such as:  Not being in the right position.  Using a bottle or pacifier too early.  Problems within your baby's mouth, tongue, or lips.  The shape of your nipples.  Your baby being born early (prematurely). Small babies often have a weak suck.  Breasts becoming overfilled with milk (engorged breasts).  Express a little milk to help soften the breast. Work with a breastfeeding specialist (Science writer) to help your baby have a good latch. How does this affect me? A poor latch may cause you to have problems such as:  Cracked nipples.  Sore nipples.  Breasts becoming overfilled with milk  Plugged milk ducts.  Low milk supply.  Breast inflammation.  Breast infection. How does this affect my baby? A poor latch may cause your baby to not be able to feed well. As a result, he or she may have trouble gaining weight. Follow these instructions at home: How to position your baby  Find a comfortable place to sit or lie down. Your neck and back should be well supported.  If you are seated, place a pillow or rolled-up blanket under your baby. This will bring him or her to the level of your breast.  Make sure that your baby's belly is facing your belly.  Try different positions to find one that works best for you and your baby. How to help your baby latch  To start, you might find it helpful to gently rub your breast. Move your fingertips in a circle as you massage from your chest wall toward your nipple. This helps milk flow. Keep doing this during feeding if needed.  Position your breast. Hold your breast with four fingers underneath and your thumb above your nipple. Keep your fingers away from your nipple and your baby's mouth. Follow these steps to help your baby  latch: 1. Rub your baby's lips gently with your finger or nipple. 2. When your baby's mouth is open wide enough, quickly bring your baby to your breast and place your whole nipple into your baby's mouth. Place as much of the colored area around your nipple (areola)as possible into your baby's mouth. 3. Your baby's tongue should be between his or her lower gum and your breast. 4. You should be able to see more areola above your baby's upper lip than below the lower lip. 5. When your baby starts sucking, you will feel a gentle pull on your nipple. You should not feel any pain. Be patient. It is common for a baby to suck for about 2-3 minutes to start the flow of breast milk. 6. Make sure that your baby's mouth is in the right position around your nipple. Your baby's lips should make a seal on your breast and be turned outward.   General instructions  Look for these signs that your baby has latched on to your nipple: ? The baby is quietly tugging or sucking without causing you pain. ? You hear the baby swallow after every 3 or 4 sucks. ? You see movement above and in front of the baby's ears while he or she is sucking.  Be aware of these signs that your baby has not latched on to your nipple: ? The baby makes sucking sounds or smacking sounds while feeding. ? You have nipple pain.  If your baby is not latched well, put your little finger between your baby's gums and your nipple. This will break the seal. Then try to help your baby latch again.  If you need help, get help from a breastfeeding specialist. Contact a doctor if:  You have cracking or soreness in your nipples that lasts longer than 1 week.  You have nipple pain.  Your breasts are filled with too much milk (engorgement), and this does not improve after 48-72 hours.  You have a plugged milk duct and a fever.  You follow the tips for a good latch but need more help.  You have a pus-like fluid coming from your breast.  Your  baby is not gaining weight.  Your baby loses weight. Summary  Latching is how your baby's mouth attaches to your nipple to breastfeed.  Try different positions for breastfeeding to find one that works best for you and your baby.  A poor latch may cause you to have cracked or sore nipples or other problems.  Work with a breastfeeding specialist (Advertising copywriter) to help your baby have a good latch. This information is not intended to replace advice given to you by your health care provider. Make sure you discuss any questions you have with your health care provider. Document Revised: 07/25/2019 Document Reviewed: 07/25/2019 Elsevier Patient Education  2021 [employer] Inc[employer]   Breastfeeding  Choosing to breastfeed is one of the best decisions you can make for yourself and your baby. A change in hormones during pregnancy causes your breasts to make breast milk in your milk-producing glands. Hormones prevent breast milk from being released before your baby is born. They also prompt milk flow after birth. Once breastfeeding has begun, thoughts of your baby, as well as his or her sucking or crying, can stimulate the release of milk from your milk-producing glands. Benefits of breastfeeding Research shows that breastfeeding offers many health benefits for infants and mothers. It also offers a cost-free and convenient way to feed your baby. For your baby  Your first milk (colostrum) helps your baby's digestive system to function better.  Special cells in your milk (antibodies) help your baby to fight off infections.  Breastfed babies are less likely to develop asthma, allergies, obesity, or type 2 diabetes. They are also at lower risk for sudden infant death syndrome (SIDS).  Nutrients in breast milk are better able to meet your baby's needs compared to infant formula.  Breast milk improves your baby's brain development. For you  Breastfeeding helps to create a very special bond between  you and your baby.  Breastfeeding is convenient. Breast milk costs nothing and is always available at the correct temperature.  Breastfeeding helps to burn calories. It helps you to lose the weight that you gained during pregnancy.  Breastfeeding makes your uterus return faster to its size before pregnancy. It also slows bleeding (lochia) after you give birth.  Breastfeeding helps to lower your risk of developing type 2 diabetes, osteoporosis, rheumatoid arthritis, cardiovascular disease, and breast, ovarian, uterine, and endometrial cancer later in life. Breastfeeding basics Starting breastfeeding  Find a comfortable place to sit or lie down, with your neck and back well-supported.  Place a pillow or a rolled-up blanket under your baby to bring him or her to the level of your breast (if you are seated). Nursing pillows are specially designed to help support your arms and your baby while you breastfeed.  Make sure that your baby's tummy (abdomen) is facing your  abdomen.  Gently massage your breast. With your fingertips, massage from the outer edges of your breast inward toward the nipple. This encourages milk flow. If your milk flows slowly, you may need to continue this action during the feeding.  Support your breast with 4 fingers underneath and your thumb above your nipple (make the letter "C" with your hand). Make sure your fingers are well away from your nipple and your baby's mouth.  Stroke your baby's lips gently with your finger or nipple.  When your baby's mouth is open wide enough, quickly bring your baby to your breast, placing your entire nipple and as much of the areola as possible into your baby's mouth. The areola is the colored area around your nipple. ? More areola should be visible above your baby's upper lip than below the lower lip. ? Your baby's lips should be opened and extended outward (flanged) to ensure an adequate, comfortable latch. ? Your baby's tongue should be  between his or her lower gum and your breast.  Make sure that your baby's mouth is correctly positioned around your nipple (latched). Your baby's lips should create a seal on your breast and be turned out (everted).  It is common for your baby to suck about 2-3 minutes in order to start the flow of breast milk. Latching Teaching your baby how to latch onto your breast properly is very important. An improper latch can cause nipple pain, decreased milk supply, and poor weight gain in your baby. Also, if your baby is not latched onto your nipple properly, he or she may swallow some air during feeding. This can make your baby fussy. Burping your baby when you switch breasts during the feeding can help to get rid of the air. However, teaching your baby to latch on properly is still the best way to prevent fussiness from swallowing air while breastfeeding. Signs that your baby has successfully latched onto your nipple  Silent tugging or silent sucking, without causing you pain. Infant's lips should be extended outward (flanged).  Swallowing heard between every 3-4 sucks once your milk has started to flow (after your let-down milk reflex occurs).  Muscle movement above and in front of his or her ears while sucking. Signs that your baby has not successfully latched onto your nipple  Sucking sounds or smacking sounds from your baby while breastfeeding.  Nipple pain. If you think your baby has not latched on correctly, slip your finger into the corner of your baby's mouth to break the suction and place it between your baby's gums. Attempt to start breastfeeding again. Signs of successful breastfeeding Signs from your baby  Your baby will gradually decrease the number of sucks or will completely stop sucking.  Your baby will fall asleep.  Your baby's body will relax.  Your baby will retain a small amount of milk in his or her mouth.  Your baby will let go of your breast by himself or  herself. Signs from you  Breasts that have increased in firmness, weight, and size 1-3 hours after feeding.  Breasts that are softer immediately after breastfeeding.  Increased milk volume, as well as a change in milk consistency and color by the fifth day of breastfeeding.  Nipples that are not sore, cracked, or bleeding. Signs that your baby is getting enough milk  Wetting at least 1-2 diapers during the first 24 hours after birth.  Wetting at least 5-6 diapers every 24 hours for the first week after birth. The urine  should be clear or pale yellow by the age of 5 days.  Wetting 6-8 diapers every 24 hours as your baby continues to grow and develop.  At least 3 stools in a 24-hour period by the age of 5 days. The stool should be soft and yellow.  At least 3 stools in a 24-hour period by the age of 7 days. The stool should be seedy and yellow.  No loss of weight greater than 10% of birth weight during the first 3 days of life.  Average weight gain of 4-7 oz (113-198 g) per week after the age of 4 days.  Consistent daily weight gain by the age of 5 days, without weight loss after the age of 2 weeks. After a feeding, your baby may spit up a small amount of milk. This is normal. Breastfeeding frequency and duration Frequent feeding will help you make more milk and can prevent sore nipples and extremely full breasts (breast engorgement). Breastfeed when you feel the need to reduce the fullness of your breasts or when your baby shows signs of hunger. This is called "breastfeeding on demand." Signs that your baby is hungry include:  Increased alertness, activity, or restlessness.  Movement of the head from side to side.  Opening of the mouth when the corner of the mouth or cheek is stroked (rooting).  Increased sucking sounds, smacking lips, cooing, sighing, or squeaking.  Hand-to-mouth movements and sucking on fingers or hands.  Fussing or crying. Avoid introducing a pacifier to  your baby in the first 4-6 weeks after your baby is born. After this time, you may choose to use a pacifier. Research has shown that pacifier use during the first year of a baby's life decreases the risk of sudden infant death syndrome (SIDS). Allow your baby to feed on each breast as long as he or she wants. When your baby unlatches or falls asleep while feeding from the first breast, offer the second breast. Because newborns are often sleepy in the first few weeks of life, you may need to awaken your baby to get him or her to feed. Breastfeeding times will vary from baby to baby. However, the following rules can serve as a guide to help you make sure that your baby is properly fed:  Newborns (babies 43 weeks of age or younger) may breastfeed every 1-3 hours.  Newborns should not go without breastfeeding for longer than 3 hours during the day or 5 hours during the night.  You should breastfeed your baby a minimum of 8 times in a 24-hour period. Breast milk pumping Pumping and storing breast milk allows you to make sure that your baby is exclusively fed your breast milk, even at times when you are unable to breastfeed. This is especially important if you go back to work while you are still breastfeeding, or if you are not able to be present during feedings. Your lactation consultant can help you find a method of pumping that works best for you and give you guidelines about how long it is safe to store breast milk.      Caring for your breasts while you breastfeed Nipples can become dry, cracked, and sore while breastfeeding. The following recommendations can help keep your breasts moisturized and healthy:  Avoid using soap on your nipples.  Wear a supportive bra designed especially for nursing. Avoid wearing underwire-style bras or extremely tight bras (sports bras).  Air-dry your nipples for 3-4 minutes after each feeding.  Use only cotton bra  pads to absorb leaked breast milk. Leaking of  breast milk between feedings is normal.  Use lanolin on your nipples after breastfeeding. Lanolin helps to maintain your skin's normal moisture barrier. Pure lanolin is not harmful (not toxic) to your baby. You may also hand express a few drops of breast milk and gently massage that milk into your nipples and allow the milk to air-dry. In the first few weeks after giving birth, some women experience breast engorgement. Engorgement can make your breasts feel heavy, warm, and tender to the touch. Engorgement peaks within 3-5 days after you give birth. The following recommendations can help to ease engorgement:  Completely empty your breasts while breastfeeding or pumping. You may want to start by applying warm, moist heat (in the shower or with warm, water-soaked hand towels) just before feeding or pumping. This increases circulation and helps the milk flow. If your baby does not completely empty your breasts while breastfeeding, pump any extra milk after he or she is finished.  Apply ice packs to your breasts immediately after breastfeeding or pumping, unless this is too uncomfortable for you. To do this: ? Put ice in a plastic bag. ? Place a towel between your skin and the bag. ? Leave the ice on for 20 minutes, 2-3 times a day.  Make sure that your baby is latched on and positioned properly while breastfeeding. If engorgement persists after 48 hours of following these recommendations, contact your health care provider or a Advertising copywriter. Overall health care recommendations while breastfeeding  Eat 3 healthy meals and 3 snacks every day. Well-nourished mothers who are breastfeeding need an additional 450-500 calories a day. You can meet this requirement by increasing the amount of a balanced diet that you eat.  Drink enough water to keep your urine pale yellow or clear.  Rest often, relax, and continue to take your prenatal vitamins to prevent fatigue, stress, and low vitamin and mineral  levels in your body (nutrient deficiencies).  Do not use any products that contain nicotine or tobacco, such as cigarettes and e-cigarettes. Your baby may be harmed by chemicals from cigarettes that pass into breast milk and exposure to secondhand smoke. If you need help quitting, ask your health care provider.  Avoid alcohol.  Do not use illegal drugs or marijuana.  Talk with your health care provider before taking any medicines. These include over-the-counter and prescription medicines as well as vitamins and herbal supplements. Some medicines that may be harmful to your baby can pass through breast milk.  It is possible to become pregnant while breastfeeding. If birth control is desired, ask your health care provider about options that will be safe while breastfeeding your baby. Where to find more information: [employer]: www.llli.org Contact a health care provider if:  You feel like you want to stop breastfeeding or have become frustrated with breastfeeding.  Your nipples are cracked or bleeding.  Your breasts are red, tender, or warm.  You have: ? Painful breasts or nipples. ? A swollen area on either breast. ? A fever or chills. ? Nausea or vomiting. ? Drainage other than breast milk from your nipples.  Your breasts do not become full before feedings by the fifth day after you give birth.  You feel sad and depressed.  Your baby is: ? Too sleepy to eat well. ? Having trouble sleeping. ? More than 57 week old and wetting fewer than 6 diapers in a 24-hour period. ? Not gaining weight by 5  days of age.  Your baby has fewer than 3 stools in a 24-hour period.  Your baby's skin or the white parts of his or her eyes become yellow. Get help right away if:  Your baby is overly tired (lethargic) and does not want to wake up and feed.  Your baby develops an unexplained fever. Summary  Breastfeeding offers many health benefits for infant and mothers.  Try  to breastfeed your infant when he or she shows early signs of hunger.  Gently tickle or stroke your baby's lips with your finger or nipple to allow the baby to open his or her mouth. Bring the baby to your breast. Make sure that much of the areola is in your baby's mouth. Offer one side and burp the baby before you offer the other side.  Talk with your health care provider or lactation consultant if you have questions or you face problems as you breastfeed. This information is not intended to replace advice given to you by your health care provider. Make sure you discuss any questions you have with your health care provider. Document Revised: 04/21/2017 Document Reviewed: 02/27/2016 Elsevier Patient Education  2021 [employer] Inc.   Postpartum Care After Vaginal Delivery The following information offers guidance about how to care for yourself from the time you deliver your baby to 6-12 weeks after delivery (postpartum period). If you have problems or questions, contact your health care provider for more specific instructions. Follow these instructions at home: Vaginal bleeding  It is normal to have vaginal bleeding (lochia) after delivery. Wear a sanitary pad for bleeding and discharge. ? During the first week after delivery, the amount and appearance of lochia is often similar to a menstrual period. ? Over the next few weeks, it will gradually decrease to a dry, yellow-brown discharge. ? For most women, lochia stops completely by 4-6 weeks after delivery, but can vary.  Change your sanitary pads frequently. Watch for any changes in your flow, such as: ? A sudden increase in volume. ? A change in color. ? Large blood clots.  If you pass a blood clot from your vagina, save it and call your health care provider. Do not flush blood clots down the toilet before talking with your health care provider.  Do not use tampons or douches until your health care provider approves.  If you are not  breastfeeding, your period should return 6-8 weeks after delivery. If you are feeding your baby breast milk only, your period may not return until you stop breastfeeding. Perineal care  Keep the area between the vagina and the anus (perineum) clean and dry. Use medicated pads and pain-relieving sprays and creams as directed.  If you had a surgical cut in the perineum (episiotomy) or a tear, check the area for signs of infection until you are healed. Check for: ? More redness, swelling, or pain. ? Fluid or blood coming from the cut or tear. ? Warmth. ? Pus or a bad smell.  You may be given a squirt bottle to use instead of wiping to clean the perineum area after you use the bathroom. Pat the area gently to dry it.  To relieve pain caused by an episiotomy, a tear, or swollen veins in the anus (hemorrhoids), take a warm sitz bath 2-3 times a day. In a sitz bath, the warm water should only come up to your hips and cover your buttocks.   Breast care  In the first few days after delivery, your breasts may  feel heavy, full, and uncomfortable (breast engorgement). Milk may also leak from your breasts. Ask your health care provider about ways to help relieve the discomfort.  If you are breastfeeding: ? Wear a bra that supports your breasts and fits well. Use breast pads to absorb milk that leaks. ? Keep your nipples clean and dry. Apply creams and ointments as told. ? You may have uterine contractions every time you breastfeed for up to several weeks after delivery. This helps your uterus return to its normal size. ? If you have any problems with breastfeeding, notify your health care provider or lactation consultant.  If you are not breastfeeding: ? Avoid touching your breasts. Do not squeeze out (express) milk. Doing this can make your breasts produce more milk. ? Wear a good-fitting bra and use cold packs to help with swelling. Intimacy and sexuality  Ask your health care provider when you can  engage in sexual activity. This may depend upon: ? Your risk of infection. ? How fast you are healing. ? Your comfort and desire to engage in sexual activity.  You are able to get pregnant after delivery, even if you have not had your period. Talk with your health care provider about methods of birth control (contraception) or family planning if you desire future pregnancies. Medicines  Take over-the-counter and prescription medicines only as told by your health care provider.  Take an over-the-counter stool softener to help ease bowel movements as told by your health care provider.  If you were prescribed an antibiotic medicine, take it as told by your health care provider. Do not stop taking the antibiotic even if you start to feel better.  Review all previous and current prescriptions to check for possible transfer into breast milk. Activity  Gradually return to your normal activities as told by your health care provider.  Rest as much as possible. Nap while your baby is sleeping. Eating and drinking  Drink enough fluid to keep your urine pale yellow.  To help prevent or relieve constipation, eat high-fiber foods every day.  Choose healthy eating to support breastfeeding or weight loss goals.  Take your prenatal vitamins until your health care provider tells you to stop.   General tips/recommendations  Do not use any products that contain nicotine or tobacco. These products include cigarettes, chewing tobacco, and vaping devices, such as e-cigarettes. If you need help quitting, ask your health care provider.  Do not drink alcohol, especially if you are breastfeeding.  Do not take medications or drugs that are not prescribed to you, especially if you are breastfeeding.  Visit your health care provider for a postpartum checkup within the first 3-6 weeks after delivery.  Complete a comprehensive postpartum visit no later than 12 weeks after delivery.  Keep all follow-up  visits for you and your baby. Contact a health care provider if:  You feel unusually sad or worried.  Your breasts become red, painful, or hard.  You have a fever or other signs of an infection.  You have bleeding that is soaking through one pad an hour or you have blood clots.  You have a severe headache that doesn't go away or you have vision changes.  You have nausea and vomiting and are unable to eat or drink anything for 24 hours. Get help right away if:  You have chest pain or difficulty breathing.  You have sudden, severe leg pain.  You faint or have a seizure.  You have thoughts about hurting yourself or  your baby. If you ever feel like you may hurt yourself or others, or have thoughts about taking your own life, get help right away. Go to your nearest emergency department or:  Call your local emergency services (911 in the [place]  The National Suicide Prevention Lifeline at 870-649-8767. This suicide crisis helpline is open 24 hours a day.  Text the Crisis Text Line at [phone] (in the [place] Summary  The period of time after you deliver your newborn up to 6-12 weeks after delivery is called the postpartum period.  Keep all follow-up visits for you and your baby.  Review all previous and current prescriptions to check for possible transfer into breast milk.  Contact a health care provider if you feel unusually sad or worried during the postpartum period. This information is not intended to replace advice given to you by your health care provider. Make sure you discuss any questions you have with your health care provider. Document Revised: 10/11/2019 Document Reviewed: 10/11/2019 Elsevier Patient Education  2021 [employer].

## 2020-07-09 NOTE — Discharge Summary (Signed)
Obstetric Discharge Summary  Patient ID: Tasha Macias MRN: [id number] DOB/AGE: [birth year] 35 y.o.   Date of Admission: 07/07/2020  Date of Discharge:  07/09/20  Admitting Diagnosis: Induction of labor at [redacted]w[redacted]d  Secondary Diagnosis:   Patient Active Problem List   Diagnosis Date Noted  . Vaginal delivery   . [redacted] weeks gestation of pregnancy   . Encounter for induction of labor 07/07/2020  . Labor and delivery, indication for care 06/27/2020  . Breech presentation of fetus 06/25/2020  . Breech presentation, no version 06/18/2020  . Obesity in pregnancy, antepartum, third trimester 06/18/2020  . Excessive weight gain affecting pregnancy 06/02/2020  . Limited prenatal care, antepartum 06/02/2020  . 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 History of pre-eclampsia 08/28/2018  . Encounter for repeat ultrasound of fetal pyelectasis in singleton pregnancy, antepartum 05/12/2018  . Abnormal pap 01/10/2012    Mode of Delivery: Normal spontaneous vaginal delivery     Discharge Diagnosis: No other diagnosis   Intrapartum Procedures: epidural, pitocin augmentation and cytotec induction/foley bulb placement   Post partum procedures: None  Complications: None   Brief Hospital Course   Sherae Santino Haberl is a [id number] who had a SVD on 07/08/2020;  for further details, please refer to the delivey summary.  Patient had an uncomplicated postpartum course.  By time of discharge on PPD#1, her pain was controlled on oral pain medications; she had appropriate lochia and was ambulating, voiding without difficulty and tolerating regular diet.  She was deemed stable for discharge to home.    Labs: CBC Latest Ref Rng & Units 07/07/2020 06/25/2020 05/01/2020  WBC 4.0 - 10.5 K/uL 9.0 6.4 6.4  Hemoglobin 12.0 - 15.0 g/dL 10.0(L) 10.5(L) 10.5(L)  Hematocrit 36.0 - 46.0 % 32.1(L) 33.6(L) 32.2(L)  Platelets 150 - 400 K/uL 235 223 207   A POS  Physical exam:   Temp:  [97.6 F (36.4 C)-98.9 F (37.2 C)]  98.9 F (37.2 C) (06/01 0335) Pulse Rate:  [77-121] 99 (06/01 0335) Resp:  [16-18] 18 (06/01 0335) BP: (96-131)/(29-98) 111/71 (06/01 0335) SpO2:  [95 %-100 %] 98 % (06/01 0335)  General: alert and no distress  Lochia: appropriate  Abdomen: soft, NT  Uterine Fundus: firm  Perineum: no significant drainage, no dehiscence, no significant erythema  Extremities: No evidence of DVT seen on physical exam. No lower extremity edema  Edinburgh Postnatal Depression Scale Screening Tool 07/08/2020 08/30/2018  I have been able to laugh and see the funny side of things. (No Data) 0  I have looked forward with enjoyment to things. - 0  I have blamed myself unnecessarily when things went wrong. - 0  I have been anxious or worried for no good reason. - 0  I have felt scared or panicky for no good reason. - 0  Things have been getting on top of me. - 0  I have been so unhappy that I have had difficulty sleeping. - 0  I have felt sad or miserable. - 0  I have been so unhappy that I have been crying. - 0  The thought of harming myself has occurred to me. - 0  Edinburgh Postnatal Depression Scale Total - 0     Discharge Instructions: Per After Visit Summary.  Activity: Advance as tolerated. Pelvic rest for 6 weeks.  Also refer to After Visit Summary  Diet: Regular  Medications: Allergies as of 07/09/2020   No Known Allergies     Medication List    STOP taking these medications  aspirin EC 81 MG tablet     TAKE these medications   acetaminophen 500 MG tablet Commonly known as: TYLENOL Take 2 tablets (1,000 mg total) by mouth every 6 (six) hours as needed for mild pain or moderate pain.   benzocaine-Menthol 20-0.5 % Aero Commonly known as: DERMOPLAST Apply 1 application topically as needed for irritation (perineal discomfort).   Fusion Plus Caps Take 1 tablet by mouth daily.   ibuprofen 600 MG tablet Commonly known as: ADVIL Take 1 tablet (600 mg total) by mouth every 6 (six)  hours.   prenatal multivitamin Tabs tablet Take 1 tablet by mouth daily at 12 noon.   sertraline 50 MG tablet Commonly known as: Zoloft Take 1 tablet (50 mg total) by mouth daily.   witch hazel-glycerin pad Commonly known as: TUCKS Apply 1 application topically as needed for hemorrhoids.      Outpatient follow up:   Follow-up Information    Gunnar Bulla, CNM Follow up.   Specialties: Certified Nurse Midwife, Obstetrics and Gynecology, Radiology Why: Someone from the office will call you to schedule a two (2) week TELEVISIT and six (6) week POSTPARTUM VISIT with JML Contact information: [address] [place] [address] [phone]              Postpartum contraception: vasectomy  Discharged Condition: stable  Discharged to: home   Newborn Data:  Disposition:home with mother  Apgars: APGAR (1 MIN): 8   APGAR (5 MINS): 9    Baby Feeding: Breast    Serafina Royals, CNM  [facility], [facility] 07/09/20 5:25 AM

## 2020-07-09 NOTE — TOC Progression Note (Signed)
Transition of Care [facility]) - Progression Note    Patient Details  Name: Tasha Macias MRN: [id number] Date of Birth: [birth year]  Transition of Care [facility]) CM/SW Contact  Hetty Ely, RN Phone Number: 07/09/2020, 10:14 AM  Clinical Narrative: EPD follow up. Spoke with patient who voices taking Zoloft, prescribed by PCP. This is baby five, however although the father does not live in the home he is supportive and will assist with caring for the baby. Patient does not work, only adult in the home and has a 33yo Autistic/ADHD son. Patient also voices some support from family and grandmother. Discussed Post partem depression and signs that may occur, and to contact PCP for help if identified, patient voices understanding. TOC to assist patient with a list of ADHD summer programs to review.  No other identified barriers for newborn.          Expected Discharge Plan and Services           Expected Discharge Date: 07/09/20                                     Social Determinants of Health (SDOH) Interventions    Readmission Risk Interventions No flowsheet data found.

## 2020-07-09 NOTE — Lactation Note (Signed)
This note was copied from a baby's chart. Lactation Consultation Note  Patient Name: Tasha Macias HENID'P Date: 07/09/2020 Reason for consult: Initial assessment;Term Age:33 hours  Initial lactation visit- anticipated discharge later today. Mom is G7P5, with extensive BF history with the last 3 children- continues to BF her almost 33yr old at this time.  Baby is feeding well, no pain/discomfort, minimal weight loss of 5g thus far, appropriate output for HOL. LC praised mom for her dedication to providing breastmilk to her children, discussed and educated mom on tandem nursing, and provided tips for weaning older child as requested.  Reviewed newborn stomach size, feeding patterns and behaviors, cluster feeding, and growth spurts. Encouraged feeding on demand and with early cues. Reviewed anticipated breast changes, breast fullness and engorgement, management of both and nipple care.   Information given for outpatient lactation services and community breastfeeding support. Mom is confident in her BF journey, and again plans to allow baby Tasha Macias to breastfeed as long as she desires. Encouraged to call out today with questions/concerns before discharge.  Maternal Data Has patient been taught Hand Expression?: Yes Does the patient have breastfeeding experience prior to this delivery?: Yes How long did the patient breastfeed?: still BF 33yr old (3 months w/ 2nd, 35yr w/ 3, continues to BF 4th)  Feeding Mother's Current Feeding Choice: Breast Milk  LATCH Score                    Lactation Tools Discussed/Used    Interventions Interventions: Breast feeding basics reviewed;Education (Tandem nursing)  Discharge Discharge Education: Engorgement and breast care;Warning signs for feeding baby;Outpatient recommendation (Tandem nursing information)  Consult Status Consult Status: Complete    Danford Bad 07/09/2020, 10:22 AM

## 2020-07-10 ENCOUNTER — Telehealth: Payer: Self-pay

## 2020-07-10 NOTE — Telephone Encounter (Signed)
Transition Care Management Unsuccessful Follow-up Telephone Call  Date of discharge and from where:  07/09/2020 from [facility]  Attempts:  1st Attempt  Reason for unsuccessful TCM follow-up call:  Unable to leave message

## 2020-07-11 NOTE — Telephone Encounter (Signed)
Transition Care Management Unsuccessful Follow-up Telephone Call  Date of discharge and from where:  07/09/2020 [facility]  Attempts:  2nd Attempt  Reason for unsuccessful TCM follow-up call:  Unable to leave message

## 2020-07-14 NOTE — Telephone Encounter (Signed)
Transition Care Management Unsuccessful Follow-up Telephone Call  Date of discharge and from where:  07/09/2020 [facility]  Attempts:  3rd Attempt  Reason for unsuccessful TCM follow-up call:  Unable to leave message

## 2020-07-21 ENCOUNTER — Other Ambulatory Visit: Payer: Self-pay

## 2020-07-21 ENCOUNTER — Ambulatory Visit (INDEPENDENT_AMBULATORY_CARE_PROVIDER_SITE_OTHER): Payer: Medicaid Other | Admitting: Certified Nurse Midwife

## 2020-07-21 DIAGNOSIS — Z8659 Personal history of other mental and behavioral disorders: Secondary | ICD-10-CM

## 2020-07-21 DIAGNOSIS — Z1331 Encounter for screening for depression: Secondary | ICD-10-CM

## 2020-07-21 DIAGNOSIS — Z8759 Personal history of other complications of pregnancy, childbirth and the puerperium: Secondary | ICD-10-CM

## 2020-07-21 NOTE — Progress Notes (Signed)
Virtual Visit via Telephone Note  I connected with Pallie Swigert Sligh on 07/21/20 at 11:30 AM EDT by telephone and verified that I am speaking with the correct person using two identifiers.  Location:  Patient: Tasha Macias (home)  Provider: Serafina Royals, CNM ([facility], [facility])  I discussed the limitations, risks, security and privacy concerns of performing an evaluation and management service by telephone and the availability of in person appointments. I also discussed with the patient that there may be a patient responsible charge related to this service. The patient expressed understanding and agreed to proceed.   History of Present Illness:  Patient is two (2) weeks postpartum spontaneous vaginal birth; for further details, please see chart.   Breastfeeding without difficulty.   Bleeding is light.   Mood is good.   Uncertain regarding contraception at this time.   Observations/Objective:  Depression screen [facility] 2/9 07/21/2020 05/14/2020 05/01/2020 04/02/2020 12/12/2019  Decreased Interest 0 1 1 1 1   Down, Depressed, Hopeless 0 1 1 1 1   PHQ - 2 Score 0 2 2 2 2   Altered sleeping 0 2 3 2 1   Tired, decreased energy 1 2 2 3 1   Change in appetite 0 1 2 0 3  Feeling bad or failure about yourself  0 1 1 1 1   Trouble concentrating 0 2 0 3 1  Moving slowly or fidgety/restless 0 1 1 1 2   Suicidal thoughts 0 0 0 1 0  PHQ-9 Score 1 11 11 13 11   Difficult doing work/chores Somewhat difficult Somewhat difficult Somewhat difficult - Very difficult   GAD 7 : Generalized Anxiety Score 07/21/2020 05/14/2020 04/02/2020  Nervous, Anxious, on Edge 1 1 3   Control/stop worrying 1 1 3   Worry too much - different things 1 1 3   Trouble relaxing 0 1 1  Restless 1 0 1  Easily annoyed or irritable 1 2 3   Afraid - awful might happen 0 0 1  Total GAD 7 Score 5 6 15   Anxiety Difficulty Somewhat difficult Somewhat difficult -    Assessment:  1. Postpartum care and examination   2.  Lactating mother   3. Negative depression screening   4. History of Postpartum depression   Plan:  Routine postpartum care recommendations.   Reviewed red flag symptoms and when to call.   RTC for PPV on 08/18/2020 at 1400 as scheduled or sooner if needed.   Follow Up Instructions:    I discussed the assessment and treatment plan with the patient. The patient was provided an opportunity to ask questions and all were answered. The patient agreed with the plan and demonstrated an understanding of the instructions.   The patient was advised to call back or seek an in-person evaluation if the symptoms worsen or if the condition fails to improve as anticipated.  I provided 6 minutes of non-face-to-face time during this encounter.    , CNM [facility]

## 2020-07-21 NOTE — Patient Instructions (Signed)
Postpartum Care After Vaginal Delivery The following information offers guidance about how to care for yourself from the time you deliver your baby to 6-12 weeks after delivery (postpartum period). If you have problems or questions, contact your health care provider for more specific instructions. Follow these instructions at home: Vaginal bleeding It is normal to have vaginal bleeding (lochia) after delivery. Wear a sanitary pad for bleeding and discharge. During the first week after delivery, the amount and appearance of lochia is often similar to a menstrual period. Over the next few weeks, it will gradually decrease to a dry, yellow-brown discharge. For most women, lochia stops completely by 4-6 weeks after delivery, but can vary. Change your sanitary pads frequently. Watch for any changes in your flow, such as: A sudden increase in volume. A change in color. Large blood clots. If you pass a blood clot from your vagina, save it and call your health care provider. Do not flush blood clots down the toilet before talking with your health care provider. Do not use tampons or douches until your health care provider approves. If you are not breastfeeding, your period should return 6-8 weeks after delivery. If you are feeding your baby breast milk only, your period may not return until you stop breastfeeding. Perineal care  Keep the area between the vagina and the anus (perineum) clean and dry. Use medicated pads and pain-relieving sprays and creams as directed. If you had a surgical cut in the perineum (episiotomy) or a tear, check the area for signs of infection until you are healed. Check for: More redness, swelling, or pain. Fluid or blood coming from the cut or tear. Warmth. Pus or a bad smell. You may be given a squirt bottle to use instead of wiping to clean the perineum area after you use the bathroom. Pat the area gently to dry it. To relieve pain caused by an episiotomy, a tear, or  swollen veins in the anus (hemorrhoids), take a warm sitz bath 2-3 times a day. In a sitz bath, the warm water should only come up to your hips and cover your buttocks. Breast care In the first few days after delivery, your breasts may feel heavy, full, and uncomfortable (breast engorgement). Milk may also leak from your breasts. Ask your health care provider about ways to help relieve the discomfort. If you are breastfeeding: Wear a bra that supports your breasts and fits well. Use breast pads to absorb milk that leaks. Keep your nipples clean and dry. Apply creams and ointments as told. You may have uterine contractions every time you breastfeed for up to several weeks after delivery. This helps your uterus return to its normal size. If you have any problems with breastfeeding, notify your health care provider or lactation consultant. If you are not breastfeeding: Avoid touching your breasts. Do not squeeze out (express) milk. Doing this can make your breasts produce more milk. Wear a good-fitting bra and use cold packs to help with swelling. Intimacy and sexuality Ask your health care provider when you can engage in sexual activity. This may depend upon: Your risk of infection. How fast you are healing. Your comfort and desire to engage in sexual activity. You are able to get pregnant after delivery, even if you have not had your period. Talk with your health care provider about methods of birth control (contraception) or family planning if you desire future pregnancies. Medicines Take over-the-counter and prescription medicines only as told by your health care provider. Take an   over-the-counter stool softener to help ease bowel movements as told by your health care provider. If you were prescribed an antibiotic medicine, take it as told by your health care provider. Do not stop taking the antibiotic even if you start to feel better. Review all previous and current prescriptions to check for  possible transfer into breast milk. Activity Gradually return to your normal activities as told by your health care provider. Rest as much as possible. Nap while your baby is sleeping. Eating and drinking  Drink enough fluid to keep your urine pale yellow. To help prevent or relieve constipation, eat high-fiber foods every day. Choose healthy eating to support breastfeeding or weight loss goals. Take your prenatal vitamins until your health care provider tells you to stop. General tips/recommendations Do not use any products that contain nicotine or tobacco. These products include cigarettes, chewing tobacco, and vaping devices, such as e-cigarettes. If you need help quitting, ask your health care provider. Do not drink alcohol, especially if you are breastfeeding. Do not take medications or drugs that are not prescribed to you, especially if you are breastfeeding. Visit your health care provider for a postpartum checkup within the first 3-6 weeks after delivery. Complete a comprehensive postpartum visit no later than 12 weeks after delivery. Keep all follow-up visits for you and your baby. Contact a health care provider if: You feel unusually sad or worried. Your breasts become red, painful, or hard. You have a fever or other signs of an infection. You have bleeding that is soaking through one pad an hour or you have blood clots. You have a severe headache that doesn't go away or you have vision changes. You have nausea and vomiting and are unable to eat or drink anything for 24 hours. Get help right away if: You have chest pain or difficulty breathing. You have sudden, severe leg pain. You faint or have a seizure. You have thoughts about hurting yourself or your baby. If you ever feel like you may hurt yourself or others, or have thoughts about taking your own life, get help right away. Go to your nearest emergency department or: Call your local emergency services (911 in the  [place] The National Suicide Prevention Lifeline at 1-800-273-8255. This suicide crisis helpline is open 24 hours a day. Text the Crisis Text Line at 741741 (in the [place] Summary The period of time after you deliver your newborn up to 6-12 weeks after delivery is called the postpartum period. Keep all follow-up visits for you and your baby. Review all previous and current prescriptions to check for possible transfer into breast milk. Contact a health care provider if you feel unusually sad or worried during the postpartum period. This information is not intended to replace advice given to you by your health care provider. Make sure you discuss any questions you have with your health care provider. Document Revised: 10/11/2019 Document Reviewed: 10/11/2019 Elsevier Patient Education  2022 [employer]  

## 2020-08-18 ENCOUNTER — Encounter: Payer: Medicaid Other | Admitting: Certified Nurse Midwife

## 2020-08-22 ENCOUNTER — Encounter: Payer: Self-pay | Admitting: Certified Nurse Midwife

## 2020-10-20 ENCOUNTER — Ambulatory Visit: Payer: Self-pay

## 2020-10-20 ENCOUNTER — Telehealth: Payer: Self-pay | Admitting: Licensed Clinical Social Worker

## 2020-10-21 ENCOUNTER — Telehealth: Payer: Self-pay | Admitting: Licensed Clinical Social Worker

## 2020-10-21 NOTE — Patient Outreach (Signed)
[facility]) Care Management  10/21/2020  Tasha Macias 05/23/1985 [id number]   LCSW completed [facility] outreach attempt today but was unable to reach patient successfully. A HIPPA compliant voice message was left encouraging patient to return call once available. LCSW or [facility] Scheduling Care Guide will reschedule patient's [facility] Social Work appointment if no return call has been made.  A HIPPA compliant phone message was left for the patient providing contact information and requesting a return call.   Dickie La, BSW, MSW, [employer] Managed Medicaid LCSW [facility]  [facility] [email or web address] Phone: [phone]

## 2020-10-21 NOTE — Patient Outreach (Signed)
[facility]) Care Management  10/21/2020  Annalee Meyerhoff Pedro Jul 02, 1985 [id number]  LCSW completed [facility] outreach attempt yesterday and today but was unable to reach patient successfully. A HIPPA compliant voice message was left encouraging patient to return call once available. LCSW will ask Scheduling Care Guide to reschedule [facility] SW appointment with patient as well.  Dickie La, BSW, MSW, [employer] Managed Medicaid LCSW [facility]  [facility] [email or web address] Phone: [phone]

## 2020-10-24 ENCOUNTER — Telehealth: Payer: Self-pay

## 2020-10-24 NOTE — Telephone Encounter (Signed)
..   Medicaid Managed Care   Unsuccessful Outreach Note  10/24/2020 Name: Tasha Macias MRN: [id number] DOB: 1985-11-18  Referred by: Patient, No Pcp Per (Inactive) Reason for referral : High Risk Managed Medicaid (I called this patient today to get her phone visit with the MM LCSW rescheduled. Her VM was full so I was not able to leave a message. I will send her a message on MyChart.)   An unsuccessful telephone outreach was attempted today. The patient was referred to the case management team for assistance with care management and care coordination.   Follow Up Plan: The care management team will reach out to the patient again over the next 7 days.   Weston Settle Care Guide, High Risk Medicaid Managed Care Embedded Care Coordination [facility]  [facility]

## 2020-11-07 ENCOUNTER — Other Ambulatory Visit: Payer: Self-pay | Admitting: Obstetrics and Gynecology

## 2020-11-07 NOTE — Patient Outreach (Signed)
Care Coordination  11/07/2020  Minie Roadcap Cassatt [birth year] [id number]   Medicaid Managed Care   Unsuccessful Outreach Note  11/07/2020 Name: Tasha Macias MRN: [id number] DOB: 1985/07/12  Referred by: Patient, No Pcp Per (Inactive) Reason for referral : High Risk Managed Medicaid (Unsuccessful telephone outreach )   A second unsuccessful telephone outreach was attempted today. The patient was referred to the case management team for assistance with care management and care coordination.   Follow Up Plan: The care management team will reach out to the patient again over the next 7 days.   Kathi Der RN, BSN [place]  [facility] - Managed Medicaid High Risk [phone].

## 2020-11-07 NOTE — Patient Instructions (Signed)
Hi Ms. Dowda, sorry I missed you today, hope you are doing okay- as a part of your Medicaid benefit, you are eligible for care management and care coordination services at no cost or copay. I was unable to reach you by phone today but would be happy to help you with your health related needs. Please feel free to call me at [phone].  A member of the Managed Medicaid care management team will reach out to you again over the next 7 days.   Kathi Der RN, BSN [place]  [facility] [employer] - Managed Medicaid High Risk [phone].

## 2020-11-12 ENCOUNTER — Other Ambulatory Visit: Payer: Self-pay | Admitting: Obstetrics and Gynecology

## 2020-11-12 ENCOUNTER — Other Ambulatory Visit: Payer: Self-pay

## 2020-11-12 NOTE — Patient Instructions (Signed)
Hi [name], thank you for speaking with me today-have a great afternoon!  [name] was given information about Medicaid Managed Care team care coordination services as a part of their [facility] Medicaid benefit. Tasha Macias verbally consented to engagement with the [facility] Managed Care team.   If you are experiencing a medical emergency, please call 911 or report to your local emergency department or urgent care.   If you have a non-emergency medical problem during routine business hours, please contact your provider's office and ask to speak with a nurse.   For questions related to your [facility] health plan, please call: [phone] or go here:[email or web address]  If you would like to schedule transportation through your [facility] plan, please call the following number at least 2 days in advance of your appointment: [phone].  Call the [facility] Crisis Line at [phone], at any time, 24 hours a day, 7 days a week. If you are in danger or need immediate medical attention call 911.  If you would like help to quit smoking, call 1-800-QUIT-NOW (986-797-4324) OR Espaol: 1-855-Djelo-Ya (1-443-154-0086) o para ms informacin haga clic aqu or Text READY to 761-950 to register via text  Tasha Macias - following are the goals we discussed in your visit today:   Goals Addressed             This Visit's Progress    Protect My Health       Timeframe:  Long-Range Goal Priority:  High Start Date:       03/12/20                      Expected End Date:  ongoing               Follow Up Date: 12/12/20   - schedule recommended health tests (blood work, mammogram, colonoscopy, pap test) - schedule and keep appointment for annual check-up  Update 11/12/20:  Patient delivered 07/08/20.  Patient needs PCP-will refer for PCP list.   The patient verbalized understanding of instructions provided today and declined a print copy of patient instruction  materials.   The Managed Medicaid care management team will reach out to the patient again over the next 30 days.  The  Patient has been provided with contact information for the Managed Medicaid care management team and has been advised to call with any health related questions or concerns.   Kathi Der RN, BSN [place]  Triad Engineer, production - Managed Medicaid High Risk [phone].    Following is a copy of your plan of care:   Patient Care Plan: General Plan of Care (Adult)     Problem Identified: Health Promotion or Disease Self-Management (General Plan of Care)      Long-Range Goal: Self-Management Plan Developed   Expected End Date: 02/12/2021  This Visit's Progress: On track  Priority: Low  Note:   Current Barriers:  Patient [redacted] weeks gestation with UTI symptoms  fatigue Update 11/12/20:  Patient delivered 07/08/20-SVD-no complaints.  Needs PCP.  Nurse Case Manager Clinical Goal(s):  Over the next 90 days, patient will work with provider to address needs related to pregnancy-completed. Over the next 90 days, patient will attend all scheduled medical appointments: Over the next 30 days, patient will meet with LCSW.  Interventions:  Inter-disciplinary care team collaboration (see longitudinal plan of care) Evaluation of current treatment plan related to pregnancy and patient's adherence to plan as established by provider. Advised patient  to contact provider for UTI symptoms, fatigue. Reviewed medications with patient. Discussed plans with patient for ongoing care management follow up and provided patient with direct contact information for care management team Reviewed scheduled/upcoming provider appointments. Collaboration with BSW for PCP BSW referral for PCP list  Patient Goals/Self-Care Activities Over the next 90 days, patient will:  -Patient will  Self administers medications as prescribed Attends all scheduled provider  appointments Calls pharmacy for medication refills Calls provider office for new concerns or questions  Follow Up Plan: The Managed Medicaid care management team will reach out to the patient again over the next 30 days.  The patient has been provided with contact information for the Managed Medicaid care management team and has been advised to call with any health related questions or concerns.

## 2020-11-12 NOTE — Patient Outreach (Addendum)
Medicaid Managed Care   Nurse Care Manager Note  11/12/2020 Name:  Tasha Macias MRN:  [id number] DOB:  1985-05-28  Tasha Macias is an 35 y.o. year old female who is a primary patient of Patient, No Pcp Per (Inactive).  The [facility] Managed Care Coordination team was consulted for assistance with:    Healthcare management needs.  [name] was given information about Medicaid Managed Care Coordination team services today. Tasha Macias Patient agreed to services and verbal consent obtained.  Engaged with patient by telephone for follow up visit in response to provider referral for case management and/or care coordination services.   Assessments/Interventions:  Review of past medical history, allergies, medications, health status, including review of consultants reports, laboratory and other test data, was performed as part of comprehensive evaluation and provision of chronic care management services.  SDOH (Social Determinants of Health) assessments and interventions performed: SDOH Interventions    Flowsheet Row Most Recent Value  SDOH Interventions   Food Insecurity Interventions Intervention Not Indicated  Housing Interventions Intervention Not Indicated  Intimate Partner Violence Interventions Intervention Not Indicated  Transportation Interventions Intervention Not Indicated       Care Plan  No Known Allergies  Medications Reviewed Today     Reviewed by Danie Chandler, RN (Registered Nurse) on 11/12/20 at 1109  Med List Status: <None>   Medication Order Taking? Sig Documenting Provider Last Dose Status Informant  acetaminophen (TYLENOL) 500 MG tablet [id number] No Take 2 tablets (1,000 mg total) by mouth every 6 (six) hours as needed for mild pain or moderate pain.  Patient not taking: Reported on 11/12/2020   Gunnar Bulla, CNM Not Taking Active   benzocaine-Menthol (DERMOPLAST) 20-0.5 % AERO [id number] No Apply 1 application topically as needed for  irritation (perineal discomfort).  Patient not taking: Reported on 11/12/2020   Gunnar Bulla, CNM Not Taking Active   ibuprofen (ADVIL) 600 MG tablet [id number] No Take 1 tablet (600 mg total) by mouth every 6 (six) hours.  Patient not taking: Reported on 11/12/2020   Gunnar Bulla, CNM Not Taking Active   Iron-FA-B Cmp-C-Biot-Probiotic (FUSION PLUS) CAPS [id number] Yes Take 1 tablet by mouth daily. Doreene Burke, CNM Taking Active   Prenatal Vit-Fe Fumarate-FA (PRENATAL MULTIVITAMIN) TABS tablet [id number] No Take 1 tablet by mouth daily at 12 noon.  Patient not taking: Reported on 11/12/2020   [provider] Not Taking Active   sertraline (ZOLOFT) 50 MG tablet [id number] No Take 1 tablet (50 mg total) by mouth daily.  Patient not taking: Reported on 11/12/2020   Doreene Burke, CNM Not Taking Active   witch hazel-glycerin (TUCKS) pad [id number] No Apply 1 application topically as needed for hemorrhoids.  Patient not taking: Reported on 11/12/2020   Gunnar Bulla, CNM Not Taking Active             Patient Active Problem List   Diagnosis Date Noted   History of postpartum depression 12/12/2019   History of pre-eclampsia 08/28/2018   Abnormal pap 01/10/2012    Conditions to be addressed/monitored per PCP order:   healthcare management needs, needs PCP  Care Plan : General Plan of Care (Adult)  Updates made by Danie Chandler, RN since 11/12/2020 12:00 AM     Problem: Health Promotion or Disease Self-Management (General Plan of Care)      Long-Range Goal: Self-Management Plan Developed   Expected End Date: 02/12/2021  This Visit's Progress: On track  Priority: Low  Note:   Current Barriers:  Patient [redacted] weeks gestation with UTI symptoms  fatigue Update 11/12/20:  Patient delivered 07/08/20-SVD-no complaints.  Needs PCP.  Nurse Case Manager Clinical Goal(s):  Over the next 90 days, patient will work with provider to address needs related  to pregnancy-completed. Over the next 90 days, patient will attend all scheduled medical appointments: Over the next 30 days, patient will meet with LCSW.  Interventions:  Inter-disciplinary care team collaboration (see longitudinal plan of care) Evaluation of current treatment plan related to pregnancy and patient's adherence to plan as established by provider. Advised patient to contact provider for UTI symptoms, fatigue. Reviewed medications with patient. Discussed plans with patient for ongoing care management follow up and provided patient with direct contact information for care management team Reviewed scheduled/upcoming provider appointments. Collaboration with BSW for PCP BSW referral for PCP list  Patient Goals/Self-Care Activities Over the next 90 days, patient will:  -Patient will  Self administers medications as prescribed Attends all scheduled provider appointments Calls pharmacy for medication refills Calls provider office for new concerns or questions  Follow Up Plan: The Managed Medicaid care management team will reach out to the patient again over the next 30 days.  The patient has been provided with contact information for the Managed Medicaid care management team and has been advised to call with any health related questions or concerns.    Follow Up:  Patient agrees to Care Plan and Follow-up.  Plan: The Managed Medicaid care management team will reach out to the patient again over the next 30 days. and The patient has been provided with contact information for the Managed Medicaid care management team and has been advised to call with any health related questions or concerns.  Date/time of next scheduled RN care management/care coordination outreach: 12/12/20 at 1230

## 2020-11-26 ENCOUNTER — Other Ambulatory Visit: Payer: Self-pay

## 2020-11-26 NOTE — Patient Instructions (Signed)
Tasha Macias ,   The [facility] Managed Care Team is available to provide assistance to you with your healthcare needs at no cost and as a benefit of your [facility] Health plan. I'm sorry I was unable to reach you today for our scheduled appointment. Our care guide will call you to reschedule our telephone appointment. Please call me at the number below. I am available to be of assistance to you regarding your healthcare needs. .   Thank you,   Dickie La, BSW, MSW, LCSW Managed Medicaid LCSW [facility]  [email or web address] Phone: [phone]

## 2020-11-26 NOTE — Patient Outreach (Signed)
[facility]) Care Management  11/26/2020  Shakayla Hickox [place] [birth year] [id number]  LCSW completed [facility] outreach attempt today but was unable to reach patient successfully. A HIPPA compliant voice message was left encouraging patient to return call once available. LCSW will ask Scheduling Care Guide to reschedule [facility] appointment with patient as well.  Dickie La, BSW, MSW, [employer] Managed Medicaid LCSW [facility]  [facility] [email or web address] Phone: [phone]

## 2020-11-27 ENCOUNTER — Telehealth: Payer: Self-pay

## 2020-11-27 NOTE — Telephone Encounter (Signed)
..   Medicaid Managed Care   Unsuccessful Outreach Note  11/27/2020 Name: Tasha Macias MRN: [id number] DOB: 1985/10/18  Referred by: Patient, No Pcp Per (Inactive) Reason for referral : High Risk Managed Medicaid (I called the patient today to get her visits with the [facility] RNCM and LCSW rescheduled. I left my name and number on her VM.)   A second unsuccessful telephone outreach was attempted today. The patient was referred to the case management team for assistance with care management and care coordination.   Follow Up Plan: The care management team will reach out to the patient again over the next 7 days.   Weston Settle Care Guide, High Risk Medicaid Managed Care Embedded Care Coordination [facility]  [facility]

## 2020-12-12 ENCOUNTER — Other Ambulatory Visit: Payer: Self-pay

## 2020-12-12 ENCOUNTER — Other Ambulatory Visit: Payer: Self-pay | Admitting: Obstetrics and Gynecology

## 2020-12-12 NOTE — Patient Outreach (Signed)
Medicaid Managed Care   Nurse Care Manager Note  12/12/2020 Name:  Naidelyn Parrella Kneisel MRN:  [id number] DOB:  [birth year]  Aliahna Statzer Marcon is an 35 y.o. year old female who is a primary patient of Patient, No Pcp Per (Inactive).  The [facility] Managed Care Coordination team was consulted for assistance with:    Healthcare management needs  Ms. Jessie was given information about [employer] team services today. Lawernce Ion Skilling Patient agreed to services and verbal consent obtained.  Engaged with patient by telephone for follow up visit in response to provider referral for case management and/or care coordination services.   Assessments/Interventions:  Review of past medical history, allergies, medications, health status, including review of consultants reports, laboratory and other test data, was performed as part of comprehensive evaluation and provision of chronic care management services.  SDOH (Social Determinants of Health) assessments and interventions performed: SDOH Interventions    Flowsheet Row Most Recent Value  SDOH Interventions   Financial Strain Interventions Intervention Not Indicated  Stress Interventions --  [LCSW referral]       Care Plan  No Known Allergies  Medications Reviewed Today     Reviewed by Danie Chandler, RN (Registered Nurse) on 12/12/20 at 1253  Med List Status: <None>   Medication Order Taking? Sig Documenting Provider Last Dose Status Informant  acetaminophen (TYLENOL) 500 MG tablet [id number]  Take 2 tablets (1,000 mg total) by mouth every 6 (six) hours as needed for mild pain or moderate pain.  Patient not taking: Reported on 11/12/2020   Gunnar Bulla, CNM  Active   benzocaine-Menthol (DERMOPLAST) 20-0.5 % AERO [id number]  Apply 1 application topically as needed for irritation (perineal discomfort).  Patient not taking: Reported on 11/12/2020   Gunnar Bulla, CNM  Active   ibuprofen (ADVIL) 600 MG tablet  [id number]  Take 1 tablet (600 mg total) by mouth every 6 (six) hours.  Patient not taking: Reported on 11/12/2020   Gunnar Bulla, CNM  Active   Iron-FA-B Cmp-C-Biot-Probiotic (FUSION PLUS) CAPS [id number] No Take 1 tablet by mouth daily.  Patient not taking: Reported on 12/12/2020   Doreene Burke, CNM Not Taking Active   Prenatal Vit-Fe Fumarate-FA (PRENATAL MULTIVITAMIN) TABS tablet [id number]  Take 1 tablet by mouth daily at 12 noon.  Patient not taking: Reported on 11/12/2020   [provider]  Active   sertraline (ZOLOFT) 50 MG tablet [id number]  Take 1 tablet (50 mg total) by mouth daily.  Patient not taking: Reported on 11/12/2020   Doreene Burke, CNM  Active   witch hazel-glycerin (TUCKS) pad [id number]  Apply 1 application topically as needed for hemorrhoids.  Patient not taking: Reported on 11/12/2020   Gunnar Bulla, CNM  Active             Patient Active Problem List   Diagnosis Date Noted   History of postpartum depression 12/12/2019   History of pre-eclampsia 08/28/2018   Abnormal pap 01/10/2012    Conditions to be addressed/monitored per PCP order:   healthcare management needs, anxiety, depression  Care Plan : General Plan of Care (Adult)  Updates made by Danie Chandler, RN since 12/12/2020 12:00 AM     Problem: Health Promotion or Disease Self-Management (General Plan of Care)   Priority: High  Onset Date: 03/12/2020     Long-Range Goal: Self-Management Plan Developed   Start Date: 03/12/2020  Expected End Date: 02/12/2021  This Visit's Progress: Not on track  Recent Progress: On track  Priority: High  Note:   Current Barriers:  Patient [redacted] weeks gestation with UTI symptoms  fatigue Update 11/12/20:  Patient delivered 07/08/20-SVD-no complaints.  Needs PCP. 12/12/20:  Patient states she does experience some anxiety/depression, but is better-needs LCSW appointment-also needs PCP information-did not receive.  Nurse Case Manager  Clinical Goal(s):  Over the next 90 days, patient will work with provider to address needs related to pregnancy-completed. Over the next 90 days, patient will attend all scheduled medical appointments: Over the next 30 days, patient will meet with LCSW. Over the next 90 days, patient will establish with a PCP.  Interventions:  Inter-disciplinary care team collaboration (see longitudinal plan of care) Evaluation of current treatment plan related to pregnancy and patient's adherence to plan as established by provider. Advised patient to contact provider for UTI symptoms, fatigue. Reviewed medications with patient. Discussed plans with patient for ongoing care management follow up and provided patient with direct contact information for care management team Reviewed scheduled/upcoming provider appointments. Collaboration with LCSW concerning anxiety/depression LCSW referral for anxiety/depression. Collaboration with BSW for PCP BSW referral for PCP list Update 12/12/20:  patient states she has not received information yet-will refer again.  Patient Goals/Self-Care Activities Over the next 90 days, patient will:  -Patient will  Self administers medications as prescribed Attends all scheduled provider appointments Calls pharmacy for medication refills Calls provider office for new concerns or questions  Follow Up Plan: The Managed Medicaid care management team will reach out to the patient again over the next 30 days.  The patient has been provided with contact information for the Managed Medicaid care management team and has been advised to call with any health related questions or concerns.    Follow Up:  Patient agrees to Care Plan and Follow-up.  Plan: The Managed Medicaid care management team will reach out to the patient again over the next 30 days. and The  Patient has been provided with contact information for the Managed Medicaid care management team and has been advised to call  with any health related questions or concerns.  Date/time of next scheduled RN care management/care coordination outreach:  01/06/21 at 0900.

## 2020-12-12 NOTE — Patient Instructions (Signed)
Hi Tasha Macias, thank you for speaking with me today-have a great afternoon!!  Tasha Macias was given information about Medicaid Managed Care team care coordination services as a part of their [facility] Medicaid benefit. Tasha Macias verbally consented to engagement with the [facility] Managed Care team.   If you are experiencing a medical emergency, please call 911 or report to your local emergency department or urgent care.   If you have a non-emergency medical problem during routine business hours, please contact your provider's office and ask to speak with a nurse.   For questions related to your [facility] health plan, please call: [phone] or go here:[email or web address]  If you would like to schedule transportation through your [facility] plan, please call the following number at least 2 days in advance of your appointment: [phone].  Call the [facility] Crisis Line at [phone], at any time, 24 hours a day, 7 days a week. If you are in danger or need immediate medical attention call 911.  If you would like help to quit smoking, call 1-800-QUIT-NOW ((408) 637-5726) OR Espaol: 1-855-Djelo-Ya (4-010-272-5366) o para ms informacin haga clic aqu or Text READY to 440-347 to register via text  Tasha Macias - following are the goals we discussed in your visit today:   Goals Addressed             This Visit's Progress    Protect My Health       Timeframe:  Long-Range Goal Priority:  High Start Date:       03/12/20                      Expected End Date:  ongoing               Follow Up Date: 01/11/21   - schedule recommended health tests (blood work, mammogram, colonoscopy, pap test) - schedule and keep appointment for annual check-up  Update 11/12/20:  Patient delivered 07/08/20.  Patient needs PCP-will refer for PCP list. Update 12/12/20:  Patient has not received information for PCP-will refer again.   The patient verbalized understanding of  instructions provided today and declined a print copy of patient instruction materials.   The Managed Medicaid care management team will reach out to the patient again over the next 30 days.  The  Patient  has been provided with contact information for the Managed Medicaid care management team and has been advised to call with any health related questions or concerns.   Kathi Der RN, BSN [place]  Triad Engineer, production - Managed Medicaid High Risk [phone].  Following is a copy of your plan of care:     Care Plan : General Plan of Care (Adult)  Updates made by Danie Chandler, RN since 12/12/2020 12:00 AM     Problem: Health Promotion or Disease Self-Management (General Plan of Care)   Priority: High  Onset Date: 03/12/2020     Long-Range Goal: Self-Management Plan Developed   Start Date: 03/12/2020  Expected End Date: 02/12/2021  This Visit's Progress: Not on track  Recent Progress: On track  Priority: High  Note:   Current Barriers:  Patient [redacted] weeks gestation with UTI symptoms  fatigue Update 11/12/20:  Patient delivered 07/08/20-SVD-no complaints.  Needs PCP. 12/12/20:  Patient states she does experience some anxiety/depression, but is better-needs LCSW appointment-also needs PCP information-did not receive.  Nurse Case Manager Clinical Goal(s):  Over the next 90 days, patient will work with  provider to address needs related to pregnancy-completed. Over the next 90 days, patient will attend all scheduled medical appointments: Over the next 30 days, patient will meet with LCSW. Over the next 90 days, patient will establish with a PCP.  Interventions:  Inter-disciplinary care team collaboration (see longitudinal plan of care) Evaluation of current treatment plan related to pregnancy and patient's adherence to plan as established by provider. Advised patient to contact provider for UTI symptoms, fatigue. Reviewed medications with  patient. Discussed plans with patient for ongoing care management follow up and provided patient with direct contact information for care management team Reviewed scheduled/upcoming provider appointments. Collaboration with LCSW concerning anxiety/depression LCSW referral for anxiety/depression. Collaboration with BSW for PCP BSW referral for PCP list Update 12/12/20:  patient states she has not received information yet-will refer again.  Patient Goals/Self-Care Activities Over the next 90 days, patient will:  -Patient will  Self administers medications as prescribed Attends all scheduled provider appointments Calls pharmacy for medication refills Calls provider office for new concerns or questions  Follow Up Plan: The Managed Medicaid care management team will reach out to the patient again over the next 30 days.  The patient has been provided with contact information for the Managed Medicaid care management team and has been advised to call with any health related questions or concerns.

## 2020-12-15 ENCOUNTER — Other Ambulatory Visit: Payer: Self-pay

## 2020-12-15 NOTE — Patient Outreach (Signed)
[facility]) Care Management  12/15/2020  Tasha Macias [birth year] [id number]  LCSW completed [facility] fourth outreach attempt today but was unable to reach patient successfully. A HIPPA compliant voice message was unable to be left encouraging patient to return call once available as mailbox was full. LCSW will close social work referral at this time and will update [facility] RNCM.  Dickie La, BSW, MSW, [employer] Managed Medicaid LCSW [facility]  [facility] [email or web address] Phone: [phone]

## 2020-12-15 NOTE — Patient Instructions (Signed)
Tasha Macias ,   The [facility] Managed Care Team is available to provide assistance to you with your healthcare needs at no cost and as a benefit of your [facility] Health plan. We have been unable to reach you on 3 separate attempts. The care management team is available to assist with your healthcare needs at any time. Please do not hesitate to contact me at the number below. .   Thank you,   Dickie La, BSW, MSW, LCSW Managed Medicaid LCSW [facility]  [address] [email or web address] Phone: [phone]

## 2020-12-16 ENCOUNTER — Other Ambulatory Visit: Payer: Self-pay | Admitting: Licensed Clinical Social Worker

## 2020-12-16 NOTE — Patient Instructions (Signed)
Visit Information  Ms. Villa was given information about Medicaid Managed Care team care coordination services and verbally consented to engagement with the [facility] Managed Care team.   [facility] LCSW attempted final outreach on 12/16/20 and was able to reach patient successfully and reschedule her missed [facility] LCSW appointment on 12/15/20 to 01/07/21.  [name], LCSW

## 2020-12-16 NOTE — Patient Outreach (Signed)
[facility]) Care Management  12/16/2020  Aleeah Greeno Getchell 28-Nov-1985 [id number]   [facility] LCSW attempted final outreach on 12/16/20 and was able to reach patient successfully and reschedule her missed [facility] LCSW appointment on 12/15/20 to 01/07/21.  Dickie La, BSW, MSW, [employer] Managed Medicaid LCSW [facility]  [facility] [email or web address] Phone: [phone]

## 2020-12-17 ENCOUNTER — Other Ambulatory Visit: Payer: Self-pay

## 2020-12-17 ENCOUNTER — Other Ambulatory Visit: Payer: Self-pay | Admitting: Obstetrics and Gynecology

## 2020-12-17 NOTE — Patient Outreach (Signed)
Care Coordination  12/17/2020  Ruvi Fullenwider Mencer 1985-07-12 [id number]  RNCM placed follow up call to patient to follow up on last visit. Patient states she is doing fine, no problems today.  Patient to call back if needs assistance.  Kathi Der RN, BSN [place]  [employer] [employer] - Managed Medicaid High Risk [phone].

## 2020-12-18 ENCOUNTER — Other Ambulatory Visit: Payer: Self-pay

## 2020-12-18 NOTE — Patient Instructions (Signed)
Visit Information  [name]  - as a part of your Medicaid benefit, you are eligible for care management and care coordination services at no cost or copay. I was unable to reach you by phone today but would be happy to help you with your health related needs. Please feel free to call me @ [phone]   A member of the Managed Medicaid care management team will reach out to you again over the next 7 days.   Gus Puma, BSW, [place] [facility]  [employer] Risk Managed Medicaid Team  [phone]

## 2020-12-18 NOTE — Patient Outreach (Signed)
Care Coordination  12/18/2020  Tasha Macias 07/18/1985 [id number]   Medicaid Managed Care   Unsuccessful Outreach Note  12/18/2020 Name: Tasha Macias MRN: [id number] DOB: 1985-07-14  Referred by: Patient, No Pcp Per (Inactive) Reason for referral : High Risk Managed Medicaid (MM Social Work [employer])   An unsuccessful telephone outreach was attempted today. The patient was referred to the case management team for assistance with care management and care coordination.   Follow Up Plan: The care management team will reach out to the patient again over the next 7 days.   Marland Kitchenajs

## 2021-01-06 ENCOUNTER — Other Ambulatory Visit: Payer: Self-pay

## 2021-01-06 ENCOUNTER — Other Ambulatory Visit: Payer: Self-pay | Admitting: Obstetrics and Gynecology

## 2021-01-06 NOTE — Patient Instructions (Signed)
Hi Tasha Macias, thank you for speaking with me this morning-have a wonderful day!  Tasha Macias was given information about Medicaid Managed Care team care coordination services as a part of their [facility] Medicaid benefit. Tasha Macias verbally consented to engagement with the [facility] Managed Care team.   If you are experiencing a medical emergency, please call 911 or report to your local emergency department or urgent care.   If you have a non-emergency medical problem during routine business hours, please contact your provider's office and ask to speak with a nurse.   For questions related to your [facility] health plan, please call: [phone] or go here:[email or web address]  If you would like to schedule transportation through your [facility] plan, please call the following number at least 2 days in advance of your appointment: [phone].  Call the [facility] Crisis Line at [phone], at any time, 24 hours a day, 7 days a week. If you are in danger or need immediate medical attention call 911.  If you would like help to quit smoking, call 1-800-QUIT-NOW ((838)683-6891) OR Espaol: 1-855-Djelo-Ya (4-888-916-9450) o para ms informacin haga clic aqu or Text READY to 388-828 to register via text  Tasha Macias - following are the goals we discussed in your visit today:   Goals Addressed             This Visit's Progress    Protect My Health       Timeframe:  Long-Range Goal Priority:  High Start Date:       03/12/20                      Expected End Date:  ongoing               Follow Up Date: 02/05/21   - schedule recommended health tests (blood work, mammogram, colonoscopy, pap test) - schedule and keep appointment for annual check-up  Update 11/12/20:  Patient delivered 07/08/20.  Patient needs PCP-will refer for PCP list. Update 12/12/20:  Patient has not received information for PCP-will refer again. Update 01/06/21: Patient in need of PCP  and dentist-referral made-will follow up.  Unsuccessful attempt noted 12/18/20.   The patient verbalized understanding of instructions provided today and declined a print copy of patient instruction materials.   The Managed Medicaid care management team will reach out to the patient again over the next 30 days.  The  Patient  has been provided with contact information for the Managed Medicaid care management team and has been advised to call with any health related questions or concerns.   Tasha Der RN, BSN [place]  [employer] [employer] - Managed Medicaid High Risk [phone].    Following is a copy of your plan of care:  Care Plan : General Plan of Care (Adult)  Updates made by Danie Chandler, RN since 01/06/2021 12:00 AM     Problem: Health Promotion or Disease Self-Management (General Plan of Care)   Priority: High  Onset Date: 03/12/2020     Long-Range Goal: Self-Management Plan Developed   Start Date: 03/12/2020  Expected End Date: 02/12/2021  Recent Progress: Not on track  Priority: High  Note:   Current Barriers:  Patient [redacted] weeks gestation with UTI symptoms  fatigue Update 11/12/20:  Patient delivered 07/08/20-SVD-no complaints.  Needs PCP. 12/12/20:  Patient states she does experience some anxiety/depression, but is better-needs LCSW appointment-also needs PCP information-did not receive. 01/06/21:  Patient still in  need of PCP resources, also needs dental resources-unsuccessful attempt 12/18/20.  Anxiety/depression-has appointment with LCSW 01/07/21.  Nurse Case Manager Clinical Goal(s):  Over the next 90 days, patient will work with provider to address needs related to pregnancy-completed. Over the next 90 days, patient will attend all scheduled medical appointments: Over the next 30 days, patient will meet with LCSW. Over the next 90 days, patient will establish with a PCP, dentist  Interventions:  Inter-disciplinary care team  collaboration (see longitudinal plan of care Reviewed medications with patient. Discussed plans with patient for ongoing care management follow up and provided patient with direct contact information for care management team Reviewed scheduled/upcoming provider appointments. Collaboration with LCSW concerning anxiety/depression LCSW referral for anxiety/depression. Collaboration with BSW for PCP, dentist BSW referral for PCP list, dental resources. 12/12/20:  Patient states she has not received information yet-will refer again. 01/06/21:  Patient needs PCP and dental information-unsuccessful attempt 12/18/20-will follow up.  Patient Goals/Self-Care Activities Over the next 90 days, patient will:  -Patient will  Self administers medications as prescribed Attends all scheduled provider appointments Calls pharmacy for medication refills Calls provider office for new concerns or questions  Follow Up Plan: The Managed Medicaid care management team will reach out to the patient again over the next 30 days.  The patient has been provided with contact information for the Managed Medicaid care management team and has been advised to call with any health related questions or concerns.

## 2021-01-06 NOTE — Patient Outreach (Signed)
Medicaid Managed Care   Nurse Care Manager Note  01/06/2021 Name:  Tasha Macias MRN:  [id number] DOB:  06-12-1985  Tasha Macias is an 35 y.o. year old female who is a primary Tasha Macias of Tasha Macias, No Pcp Per (Inactive).  The [facility] Managed Care Coordination team was consulted for assistance with:    Healthcare management needs.  Ms. Show was given information about Medicaid Managed Care Coordination team services today. Tasha Macias Tasha Macias agreed to services and verbal consent obtained.  Engaged with Tasha Macias by telephone for follow up visit in response to provider referral for case management and/or care coordination services.   Assessments/Interventions:  Review of past medical history, allergies, medications, health status, including review of consultants reports, laboratory and other test data, was performed as part of comprehensive evaluation and provision of chronic care management services.  SDOH (Social Determinants of Health) assessments and interventions performed:   Care Plan  No Known Allergies  Medications Reviewed Today     Reviewed by Danie Chandler, RN (Registered Nurse) on 01/06/21 at [phone]  Med List Status: <None>   Medication Order Taking? Sig Documenting Provider Last Dose Status Informant  acetaminophen (TYLENOL) 500 MG tablet [id number] No Take 2 tablets (1,000 mg total) by mouth every 6 (six) hours as needed for mild pain or moderate pain.  Tasha Macias not taking: Reported on 11/12/2020   Gunnar Bulla, CNM Not Taking Active   benzocaine-Menthol (DERMOPLAST) 20-0.5 % AERO [id number] No Apply 1 application topically as needed for irritation (perineal discomfort).  Tasha Macias not taking: Reported on 11/12/2020   Gunnar Bulla, CNM Not Taking Active   ibuprofen (ADVIL) 600 MG tablet [id number] No Take 1 tablet (600 mg total) by mouth every 6 (six) hours.  Tasha Macias not taking: Reported on 11/12/2020   Gunnar Bulla, CNM Not Taking  Active   Iron-FA-B Cmp-C-Biot-Probiotic (FUSION PLUS) CAPS [id number] No Take 1 tablet by mouth daily.  Tasha Macias not taking: Reported on 12/12/2020   Doreene Burke, CNM Not Taking Active   Prenatal Vit-Fe Fumarate-FA (PRENATAL MULTIVITAMIN) TABS tablet [id number] No Take 1 tablet by mouth daily at 12 noon.  Tasha Macias not taking: Reported on 11/12/2020   [provider] Not Taking Active   sertraline (ZOLOFT) 50 MG tablet [id number] No Take 1 tablet (50 mg total) by mouth daily.  Tasha Macias not taking: Reported on 11/12/2020   Doreene Burke, CNM Not Taking Active   witch hazel-glycerin (TUCKS) pad [id number] No Apply 1 application topically as needed for hemorrhoids.  Tasha Macias not taking: Reported on 11/12/2020   Gunnar Bulla, CNM Not Taking Active             Tasha Macias Active Problem List   Diagnosis Date Noted   History of postpartum depression 12/12/2019   History of pre-eclampsia 08/28/2018   Abnormal pap 01/10/2012    Conditions to be addressed/monitored per PCP order:   healthcare management needs, .anxiety/depression, resources.  Care Plan : General Plan of Care (Adult)  Updates made by Danie Chandler, RN since 01/06/2021 12:00 AM     Problem: Health Promotion or Disease Self-Management (General Plan of Care)   Priority: High  Onset Date: 03/12/2020     Long-Range Goal: Self-Management Plan Developed   Start Date: 03/12/2020  Expected End Date: 02/12/2021  Recent Progress: Not on track  Priority: High  Note:   Current Barriers:  Tasha Macias [redacted] weeks gestation with UTI symptoms  fatigue Update 11/12/20:  Tasha Macias delivered 07/08/20-SVD-no complaints.  Needs  PCP. 12/12/20:  Tasha Macias states she does experience some anxiety/depression, but is better-needs LCSW appointment-also needs PCP information-did not receive. 01/06/21:  Tasha Macias still in need of PCP resources, also needs dental resources-unsuccessful attempt 12/18/20.  Anxiety/depression-has appointment with LCSW  01/07/21.  Nurse Case Manager Clinical Goal(s):  Over the next 90 days, Tasha Macias will work with provider to address needs related to pregnancy-completed. Over the next 90 days, Tasha Macias will attend all scheduled medical appointments: Over the next 30 days, Tasha Macias will meet with LCSW. Over the next 90 days, Tasha Macias will establish with a PCP, dentist  Interventions:  Inter-disciplinary care team collaboration (see longitudinal plan of care Reviewed medications with Tasha Macias. Discussed plans with Tasha Macias for ongoing care management follow up and provided Tasha Macias with direct contact information for care management team Reviewed scheduled/upcoming provider appointments. Collaboration with LCSW concerning anxiety/depression LCSW referral for anxiety/depression. Collaboration with BSW for PCP, dentist BSW referral for PCP list, dental resources. 12/12/20:  Tasha Macias states she has not received information yet-will refer again. 01/06/21:  Tasha Macias needs PCP and dental information-unsuccessful attempt 12/18/20-will follow up.  Tasha Macias Goals/Self-Care Activities Over the next 90 days, Tasha Macias will:  -Tasha Macias will  Self administers medications as prescribed Attends all scheduled provider appointments Calls pharmacy for medication refills Calls provider office for new concerns or questions  Follow Up Plan: The Managed Medicaid care management team will reach out to the Tasha Macias again over the next 30 days.  The Tasha Macias has been provided with contact information for the Managed Medicaid care management team and has been advised to call with any health related questions or concerns.     Follow Up:  Tasha Macias agrees to Care Plan and Follow-up.  Plan: The Managed Medicaid care management team will reach out to the Tasha Macias again over the next 30 days. and The  Tasha Macias has been provided with contact information for the Managed Medicaid care management team and has been advised to call with any health related  questions or concerns.  Date/time of next scheduled RN care management/care coordination outreach:  02/05/21 at 0900.

## 2021-01-07 ENCOUNTER — Other Ambulatory Visit: Payer: Self-pay | Admitting: Licensed Clinical Social Worker

## 2021-01-07 DIAGNOSIS — Z8659 Personal history of other mental and behavioral disorders: Secondary | ICD-10-CM

## 2021-01-07 DIAGNOSIS — Z8759 Personal history of other complications of pregnancy, childbirth and the puerperium: Secondary | ICD-10-CM

## 2021-01-07 NOTE — Patient Instructions (Addendum)
   Visit Information  Tasha Macias was given information about Medicaid Managed Care team care coordination services as a part of their [facility] Medicaid benefit. [name] verbally consented to engagement with the [facility] Managed Care team.   If you are experiencing a medical emergency, please call 911 or report to your local emergency department or urgent care.   If you have a non-emergency medical problem during routine business hours, please contact your provider's office and ask to speak with a nurse.   For questions related to your [facility] health plan, please call: [phone] or go here:[email or web address]  If you would like to schedule transportation through your [facility] plan, please call the following number at least 2 days in advance of your appointment: [phone].  Call the [facility] Crisis Line at [phone], at any time, 24 hours a day, 7 days a week. If you are in danger or need immediate medical attention call 911.  If you would like help to quit smoking, call 1-800-QUIT-NOW (424-227-1483) OR Espaol: 1-855-Djelo-Ya (7-902-409-7353) o para ms informacin haga clic aqu or Text READY to 299-242 to register via text   Following is a copy of your plan of care:  Care Plan : General Social Work (Adult)  Updates made by [name], LCSW since 01/07/2021 12:00 AM     Problem: Depression Identification (Depression)      Long-Range Goal: Depressive Symptoms Identified   Start Date: 01/07/2021  Priority: High  Note:   Timeframe:  Long-Range Goal Priority:  High Start Date:    01/07/21                         Expected End Date:   ongoing                     Follow Up Date 01/28/21  Current barriers:   Chronic Mental Health needs related to Depression and Sleeping Concerns Limited social support and Social Isolation Needs Support, Education, and Care Coordination in order to meet unmet mental health needs. Clinical  Goal(s): demonstrate a reduction in symptoms related to :Depression: depressed mood loss of energy/fatigue disturbed sleep  verbalize understanding of plan for management of Depression   Patient reports wanting a list of primary care providers and dental resources. [facility] LCSW will update [facility] RNCM and [facility] BSW.  Inter-disciplinary care team collaboration (see longitudinal plan of care) Patient Goals/Self-Care Activities: Over the next 120 days Call [facility] to schedule your counseling appointment. Call [facility] to follow up on referral placed on 01/07/21     Dickie La, BSW, MSW, [employer] Managed Medicaid LCSW [facility]  [facility] [email or web address] Phone: [phone]

## 2021-01-07 NOTE — Patient Outreach (Signed)
Medicaid Managed Care Social Work Note  01/07/2021 Name:  Tasha Macias MRN:  [id number] DOB:  [birth year]  Tasha Macias is an 35 y.o. year old female who is a primary patient of Patient, No Pcp Per (Inactive).  The Medicaid Managed Care Coordination team was consulted for assistance with:  Mental Health Counseling and Resources  [name] was given information about [employer] team services today. Tasha Macias Patient agreed to services and verbal consent obtained.  Engaged with patient  for by telephone forinitial visit in response to referral for case management and/or care coordination services.   Assessments/Interventions:  Review of past medical history, allergies, medications, health status, including review of consultants reports, laboratory and other test data, was performed as part of comprehensive evaluation and provision of chronic care management services.  SDOH: (Social Determinant of Health) assessments and interventions performed: SDOH Interventions    Flowsheet Row Most Recent Value  SDOH Interventions   Stress Interventions Offered [employer], Provide Counseling       Advanced Directives Status:  See Care Plan for related entries.  Care Plan                 No Known Allergies  Medications Reviewed Today     Reviewed by Tasha Chandler, RN (Registered Nurse) on 01/06/21 at [phone]  Med List Status: <None>   Medication Order Taking? Sig Documenting Provider Last Dose Status Informant  acetaminophen (TYLENOL) 500 MG tablet [id number] No Take 2 tablets (1,000 mg total) by mouth every 6 (six) hours as needed for mild pain or moderate pain.  Patient not taking: Reported on 11/12/2020   Tasha Macias Not Taking Active   benzocaine-Menthol (DERMOPLAST) 20-0.5 % AERO [id number] No Apply 1 application topically as needed for irritation (perineal discomfort).  Patient not taking: Reported on 11/12/2020   Tasha Macias Not Taking Active   ibuprofen (ADVIL) 600 MG tablet [id number] No Take 1 tablet (600 mg total) by mouth every 6 (six) hours.  Patient not taking: Reported on 11/12/2020   Tasha Macias Not Taking Active   Iron-FA-B Cmp-C-Biot-Probiotic (FUSION PLUS) CAPS [id number] No Take 1 tablet by mouth daily.  Patient not taking: Reported on 12/12/2020   Tasha Macias Not Taking Active   Prenatal Vit-Fe Fumarate-FA (PRENATAL MULTIVITAMIN) TABS tablet [id number] No Take 1 tablet by mouth daily at 12 noon.  Patient not taking: Reported on 11/12/2020   [provider] Not Taking Active   sertraline (ZOLOFT) 50 MG tablet [id number] No Take 1 tablet (50 mg total) by mouth daily.  Patient not taking: Reported on 11/12/2020   Tasha Macias Not Taking Active   witch hazel-glycerin (TUCKS) pad [id number] No Apply 1 application topically as needed for hemorrhoids.  Patient not taking: Reported on 11/12/2020   Tasha Macias Not Taking Active             Patient Active Problem List   Diagnosis Date Noted   History of postpartum depression 12/12/2019   History of pre-eclampsia 08/28/2018   Abnormal pap 01/10/2012    Conditions to be addressed/monitored per PCP order:  Depression  Care Plan : General Social Work (Adult)  Updates made by Tasha Bryant, LCSW since 01/07/2021 12:00 AM     Problem: Depression Identification (Depression)      Long-Range Goal: Depressive Symptoms Identified   Start Date: 01/07/2021  Priority: High  Note:   Timeframe:  Long-Range Goal Priority:  High Start Date:    01/07/21                         Expected End Date:   ongoing                     Follow Up Date 01/28/21  Current barriers:   Chronic Mental Health needs related to Depression and Sleeping Concerns Limited social support and Social Isolation Needs Support, Education, and Care Coordination in order to meet unmet mental health  needs. Clinical Goal(s): demonstrate a reduction in symptoms related to :Depression: depressed mood loss of energy/fatigue disturbed sleep  verbalize understanding of plan for management of Depression   Clinical Interventions:  Assessed patient's previous and current treatment, coping skills, support system and barriers to care  Motivational Interviewing employed Depression screen reviewed  Mindfulness or Relaxation training provided Active listening / Reflection utilized  Emotional Support Provided Behavioral Activation reviewed Provided brief CBT  Quality of sleep assessed & Sleep Hygiene techniques promoted  Caregiver stress acknowledged  Participation in counseling encouraged  Increase in actives / exercise encouraged  Verbalization of feelings encouraged  Crisis Resource Education / information provided  Discussed Guardianship and reviewed process  Discussed Health Care Power of Attorney  Discussed referral for psychiatry  Made referral to [facility]  ; Review resources, discussed options and provided patient information about  Options for mental health treatment based on need and insurance Patient lives alone with her 5 children. She is experiencing signs of depression and insomnia. She reports that she feels fatigue on most days because of her lack of sleep as she has a new born baby that wakes up several times throughout the night. Patient is interested in counseling and medication management. [facility] LCSW will make referral to [facility] and ask if they can provide virtual sessions because of her limited childcare.  Patient denies any current SI/HI. Patient reports wanting a list of primary care providers and dental resources. [facility] LCSW will update [facility] RNCM and [facility] BSW.  Inter-disciplinary care team collaboration (see longitudinal plan of care) Patient Goals/Self-Care Activities: Over the next 120 days Call [facility] to schedule your counseling appointment. Call [facility] to follow up on referral placed  on 01/07/21        Follow up:  Patient agrees to Care Plan and Follow-up.  Plan: The Managed Medicaid care management team will reach out to the patient again over the next 30 days.  Date/time of next scheduled Social Work care management/care coordination outreach:  01/28/21 at 11:00 am  Dickie La, BSW, MSW, [employer] Managed Medicaid LCSW [facility]  [facility] [employer] [email or web address] Phone: [phone]

## 2021-01-11 IMAGING — US OBSTETRIC 14+ WK ULTRASOUND
1 of 2 series · 13 of 28 positions shown · non-contrast
Comparison: none

CLINICAL DATA: Current assigned gestational age of 21 weeks 0 days.
Evaluate fetal growth and anatomy.

EXAM:
OBSTETRICAL ULTRASOUND >14 WKS

[Series 1: obstetric 14+ wk ultrasound · 0.23mm/px · 13 of 72 slices shown]
[im 3/72]
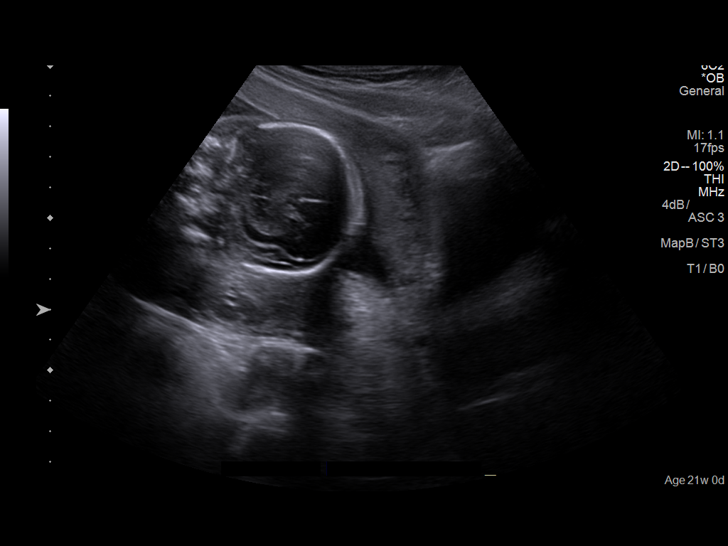
[im 9/72]
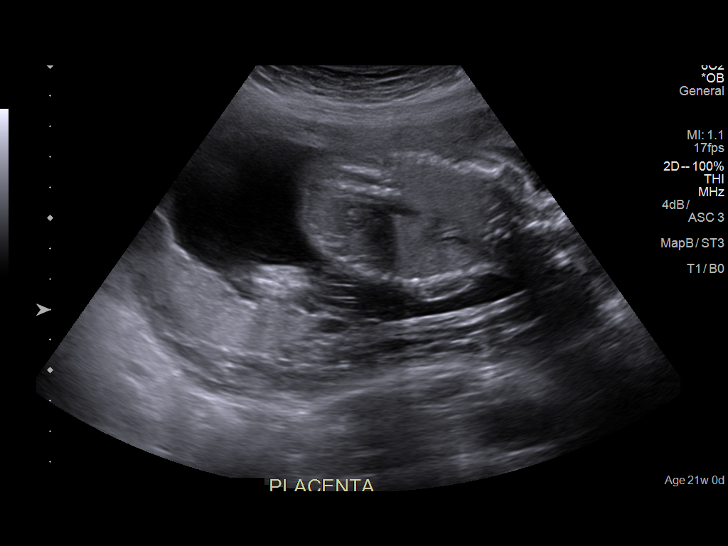
[im 14/72]
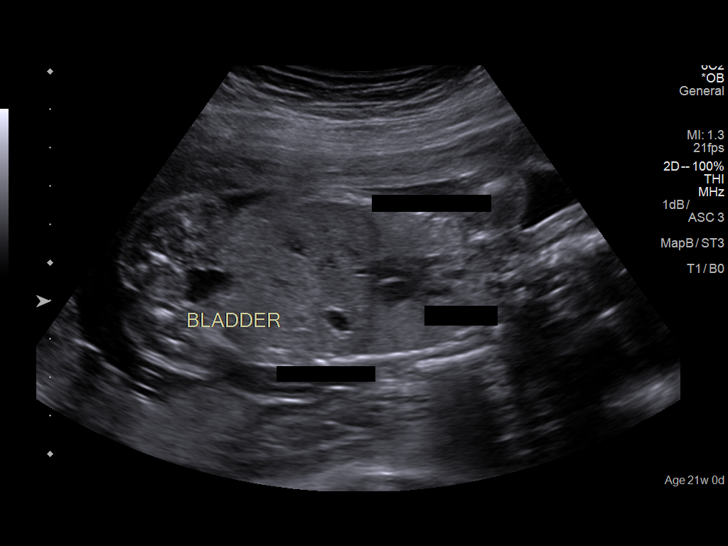
[im 20/72]
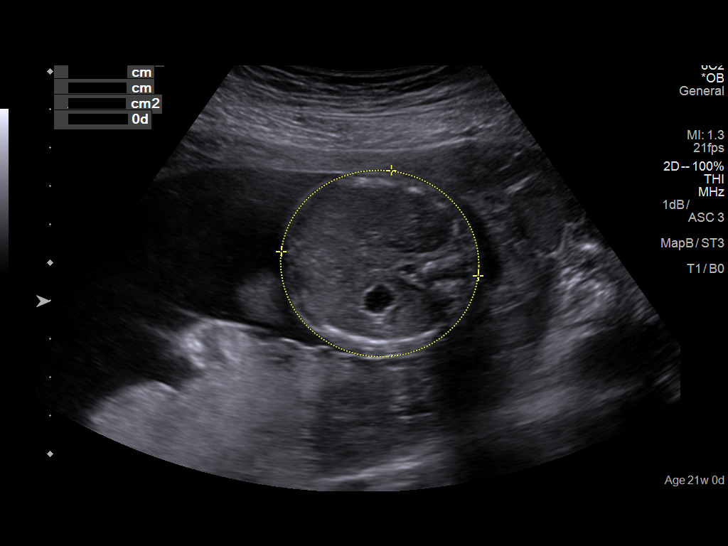
[im 25/72]
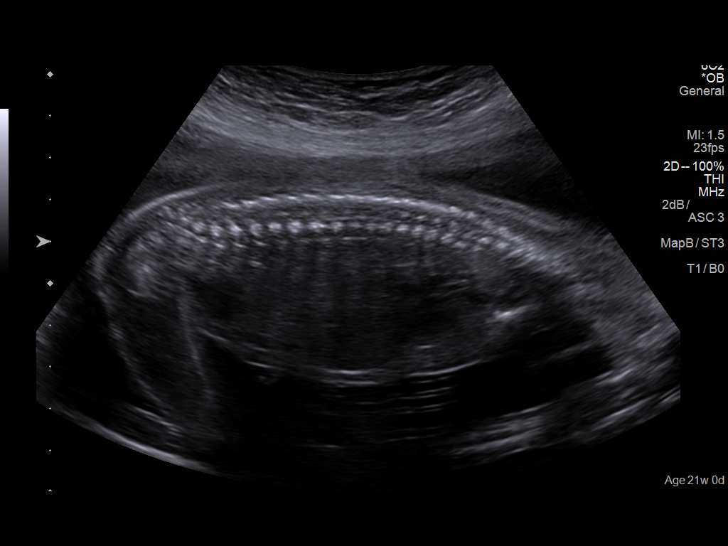
[im 31/72]
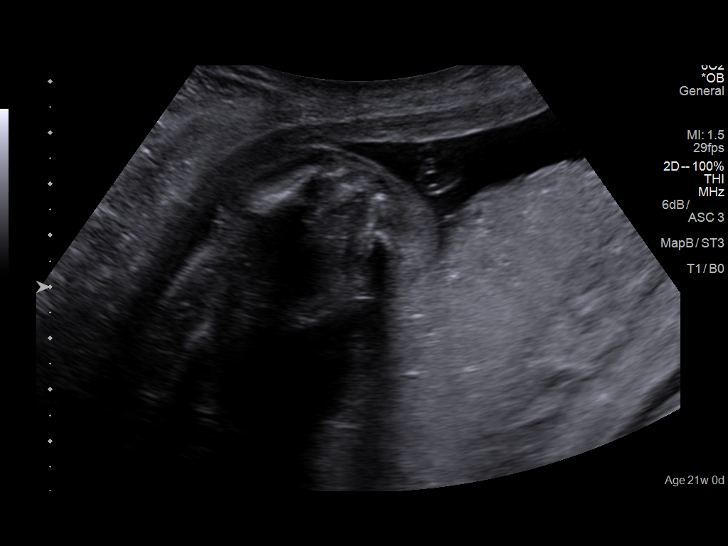
[im 39/72]
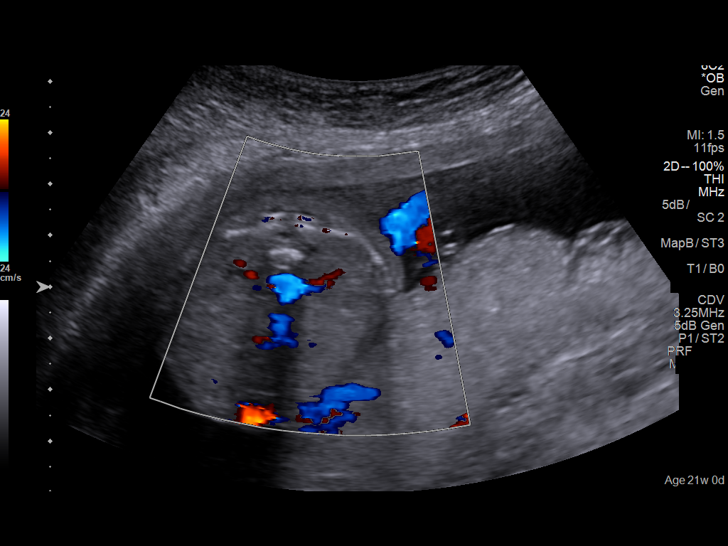
[im 44/72]
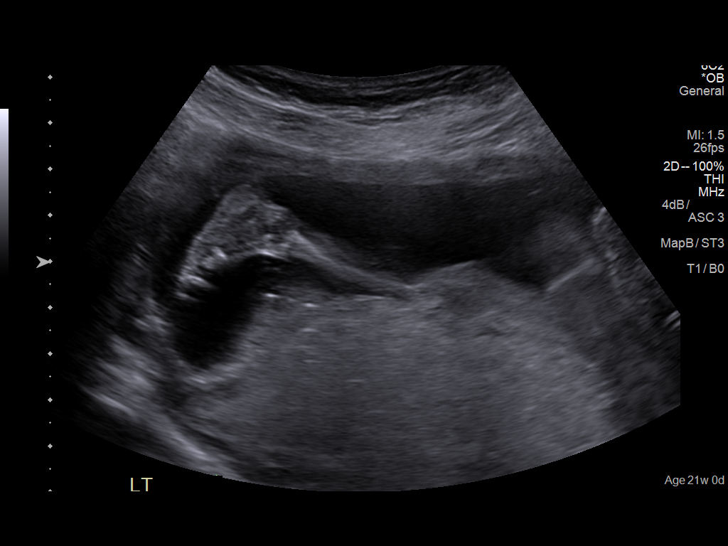
[im 50/72]
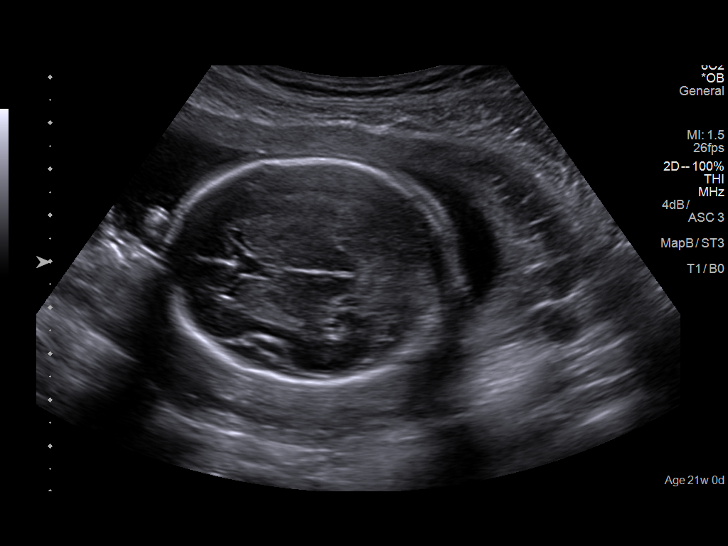
[im 55/72]
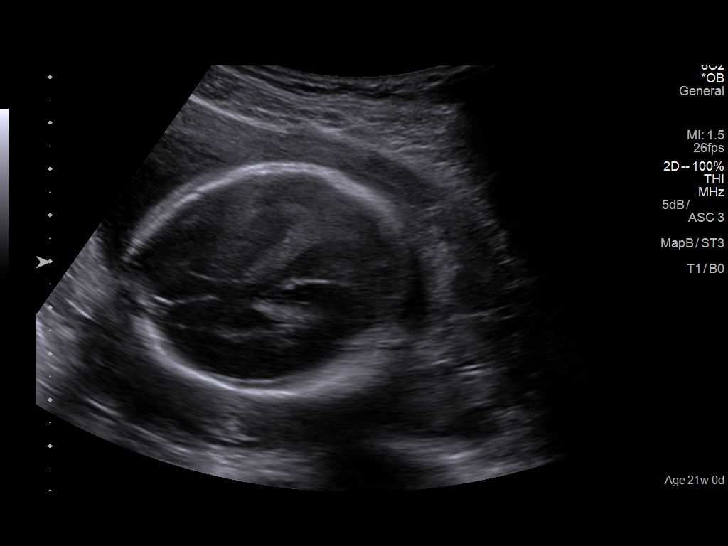
[im 61/72]
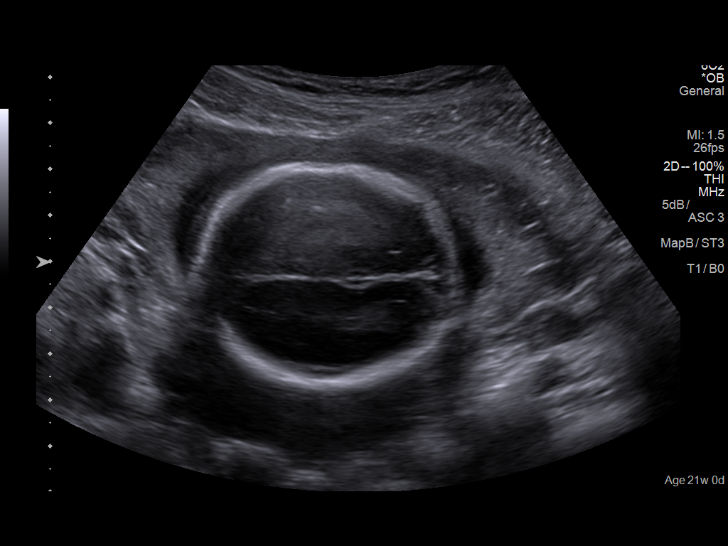
[im 66/72]
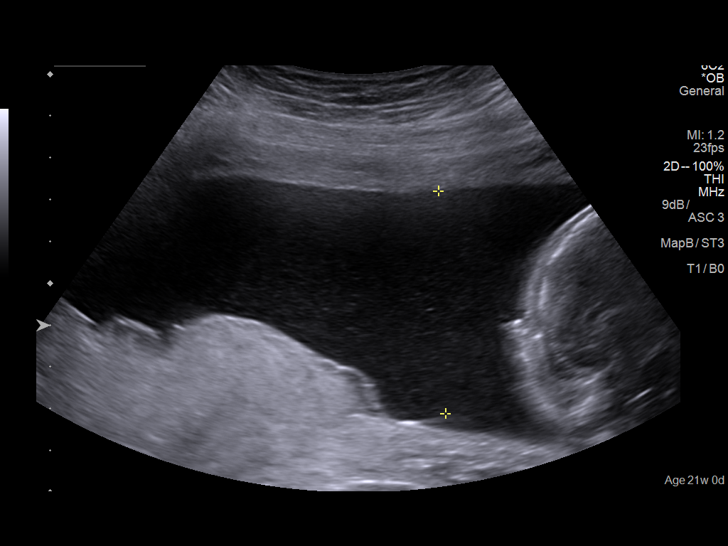
[im 72/72]
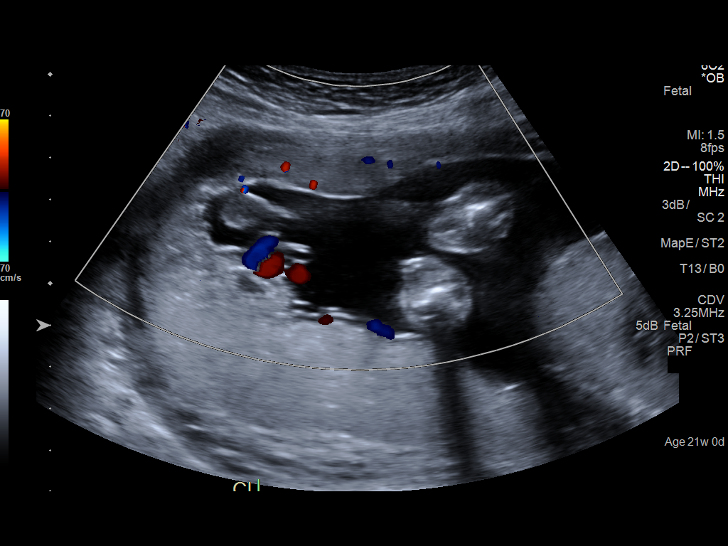

[13 of 28 positions shown; findings below may reference images not displayed]

FINDINGS: Number of Fetuses: 1

Heart Rate:  143 bpm

Movement: Yes

Presentation: Cephalic

Previa: No

Placental Location: Posterior

Amniotic Fluid (Subjective): Within normal limits

Amniotic Fluid (Objective):

Vertical pocket = 5.3cm

FETAL BIOMETRY

BPD: 4.9cm 20w 6d

HC:   18.9cm 21w 1d

AC:   16.0cm 21w 1d

FL:   3.5cm 21w 1d

Current Mean GA: 21w 0d US EDC: 08/23/2018

Assigned GA:  21w 0d Assigned EDC: 08/23/2018

FETAL ANATOMY

Lateral Ventricles: Appears normal

Thalami/CSP: Appears normal

Posterior Fossa:  Appears normal

Nuchal Region: Appears normal   NFT= N/A > 20 WKS

Upper Lip: Appears normal

Spine: Appears normal

4 Chamber Heart on Left: Appears normal

LVOT: Appears normal

RVOT: Appears normal

Stomach on Left: Appears normal

3 Vessel Cord: Appears normal

Cord Insertion site: Appears normal

Kidneys: Present; mild right fetal renal pyelectasis seen measuring
4 mm

Bladder: Appears normal

Extremities: Appears normal

Maternal Findings:

Cervix:  3.7 cm TA
IMPRESSION: Current assigned gestational age of 21 weeks 0 days. Appropriate
fetal growth.

No fetal anomalies identified.

Mild right fetal renal pyelectasis measuring 4 mm. This is
considered a soft sonographic marker for Trisomy 21. Recommend
correlation with antenatal screening test results if performed, and
consider genetic counseling if desired. Followup by ultrasound is
also recommended in 8 weeks.

## 2021-01-20 ENCOUNTER — Other Ambulatory Visit: Payer: Self-pay

## 2021-01-20 NOTE — Patient Instructions (Signed)
Visit Information  [name]  - as a part of your Medicaid benefit, you are eligible for care management and care coordination services at no cost or copay. I was unable to reach you by phone today but would be happy to help you with your health related needs. Please feel free to call me @ [phone]   A member of the Managed Medicaid care management team will reach out to you again over the next 7 days.   Kemuel Buchmann, BSW, MHA [facility]  [place]  High Risk Managed Medicaid Team  [phone]  

## 2021-01-20 NOTE — Patient Outreach (Signed)
Care Coordination  01/20/2021  Tasha Macias 17-Jul-1985 [id number]   Medicaid Managed Care   Unsuccessful Outreach Note  01/20/2021 Name: Tasha Macias MRN: [id number] DOB: [birth year]  Referred by: Patient, No Pcp Per (Inactive) Reason for referral : High Risk Managed Medicaid (MM Social Work Unsuccessful [employer])   An unsuccessful telephone outreach was attempted today. The patient was referred to the case management team for assistance with care management and care coordination.   Follow Up Plan: The care management team will reach out to the patient again over the next 7 days.   Gus Puma, BSW, [place] [facility]   [employer] Risk Managed Medicaid Team  [phone]

## 2021-01-28 ENCOUNTER — Telehealth: Payer: Self-pay

## 2021-01-28 ENCOUNTER — Other Ambulatory Visit: Payer: Self-pay | Admitting: Licensed Clinical Social Worker

## 2021-01-28 NOTE — Patient Outreach (Signed)
Medicaid Managed Care Social Work Note  01/28/2021 Name:  Tasha Macias MRN:  [id number] DOB:  1985-12-07  Dellanira Dillow Schoening is an 35 y.o. year old female who is a primary patient of Patient, No Pcp Per (Inactive).  The Medicaid Managed Care Coordination team was consulted for assistance with:  Mental Health Counseling and Resources  Ms. Vila was given information about [employer] team services today. [place] [employer] Patient agreed to services and verbal consent obtained.  Engaged with patient  for by telephone forfollow up visit in response to referral for case management and/or care coordination services.   Assessments/Interventions:  Review of past medical history, allergies, medications, health status, including review of consultants reports, laboratory and other test data, was performed as part of comprehensive evaluation and provision of chronic care management services.  SDOH: (Social Determinant of Health) assessments and interventions performed: SDOH Interventions    Flowsheet Row Most Recent Value  SDOH Interventions   Housing Interventions Other (Comment)  [Patient will be referred to [facility] BSW]  Stress Interventions Provide Counseling, [employer] Resources       Advanced Directives Status:  See Care Plan for related entries.  Care Plan                 No Known Allergies  Medications Reviewed Today     Reviewed by Danie Chandler, RN (Registered Nurse) on 01/06/21 at [phone]  Med List Status: <None>   Medication Order Taking? Sig Documenting Provider Last Dose Status Informant  acetaminophen (TYLENOL) 500 MG tablet [id number] No Take 2 tablets (1,000 mg total) by mouth every 6 (six) hours as needed for mild pain or moderate pain.  Patient not taking: Reported on 11/12/2020   Gunnar Bulla, CNM Not Taking Active   benzocaine-Menthol (DERMOPLAST) 20-0.5 % AERO [id number] No Apply 1 application topically as needed for irritation  (perineal discomfort).  Patient not taking: Reported on 11/12/2020   Gunnar Bulla, CNM Not Taking Active   ibuprofen (ADVIL) 600 MG tablet [id number] No Take 1 tablet (600 mg total) by mouth every 6 (six) hours.  Patient not taking: Reported on 11/12/2020   Gunnar Bulla, CNM Not Taking Active   Iron-FA-B Cmp-C-Biot-Probiotic (FUSION PLUS) CAPS [id number] No Take 1 tablet by mouth daily.  Patient not taking: Reported on 12/12/2020   Doreene Burke, CNM Not Taking Active   Prenatal Vit-Fe Fumarate-FA (PRENATAL MULTIVITAMIN) TABS tablet [id number] No Take 1 tablet by mouth daily at 12 noon.  Patient not taking: Reported on 11/12/2020   [provider] Not Taking Active   sertraline (ZOLOFT) 50 MG tablet [id number] No Take 1 tablet (50 mg total) by mouth daily.  Patient not taking: Reported on 11/12/2020   Doreene Burke, CNM Not Taking Active   witch hazel-glycerin (TUCKS) pad [id number] No Apply 1 application topically as needed for hemorrhoids.  Patient not taking: Reported on 11/12/2020   Gunnar Bulla, CNM Not Taking Active             Patient Active Problem List   Diagnosis Date Noted   History of postpartum depression 12/12/2019   History of pre-eclampsia 08/28/2018   Abnormal pap 01/10/2012    Conditions to be addressed/monitored per PCP order:  Anxiety and Dementia  Care Plan : General Social Work (Adult)  Updates made by [name], LCSW since 01/28/2021 12:00 AM     Problem: Depression Identification (Depression)      Long-Range Goal: Depressive  Symptoms Identified   Start Date: 01/07/2021  Priority: High  Note:   Timeframe:  Long-Range Goal Priority:  High Start Date:    01/07/21                         Expected End Date:   ongoing                     Follow Up Date 02/26/21  Current barriers:   Chronic Mental Health needs related to Depression and Sleeping Concerns Limited social support and Social  Isolation Needs Support, Education, and Care Coordination in order to meet unmet mental health needs. Clinical Goal(s): demonstrate a reduction in symptoms related to :Depression: depressed mood loss of energy/fatigue disturbed sleep  verbalize understanding of plan for management of Depression   Clinical Interventions:  Assessed patient's previous and current treatment, coping skills, support system and barriers to care  Motivational Interviewing employed Depression screen reviewed  Mindfulness or Relaxation training provided Active listening / Reflection utilized  Emotional Support Provided Behavioral Activation reviewed Provided brief CBT  Quality of sleep assessed & Sleep Hygiene techniques promoted  Caregiver stress acknowledged  Participation in counseling encouraged  Increase in actives / exercise encouraged  Verbalization of feelings encouraged  Crisis Resource Education / information provided  Discussed Guardianship and reviewed process  Discussed Health Care Power of Attorney  Discussed referral for psychiatry  Made referral to [facility]  ; Review resources, discussed options and provided patient information about  Options for mental health treatment based on need and insurance Patient lives alone with her 5 children. She is experiencing signs of depression and insomnia. She reports that she feels fatigue on most days because of her lack of sleep as she has a new born baby that wakes up several times throughout the night. Patient is interested in counseling and medication management. [facility] LCSW will make referral to [facility] and ask if they can provide virtual sessions because of her limited childcare.  Patient denies any current SI/HI.Update 01/28/21- Patient reports that she has heard back from [facility] and received their intake paperwork. She will need to complete this before scheduling her medication management and counseling appointment. Patient reports that she will complete paperwork as  soon as she can but she just found out that she did not pass inspection and will have to turn in her keys on 02/08/21. Patient was advised to contact PCP (PCP chosen) to schedule an appointment. Patient was advised to return call back to [facility] BSW. Information sent to her by patient message securely by My Chart. Patient was provided emotional support during the time. [facility] LCSW will update [facility] BSW on 01/28/21. Patient reports wanting a list of primary care providers and dental resources. [facility] LCSW will update [facility] RNCM and [facility] BSW.  Inter-disciplinary care team collaboration (see longitudinal plan of care) Patient Goals/Self-Care Activities: Over the next 120 days Call [facility] to schedule your counseling appointment. Call [facility] to follow up on referral placed on 01/07/21        Follow up:  Patient agrees to Care Plan and Follow-up.  Plan: The Managed Medicaid care management team will reach out to the patient again over the next 30 days.  Date/time of next scheduled Social Work care management/care coordination outreach:  02/26/21 at 11 am  Dickie La, BSW, MSW, [employer] Managed Medicaid LCSW [facility]   [facility] [employer] [email or web address] Phone: [phone]

## 2021-01-28 NOTE — Patient Instructions (Signed)
Visit Information  Tasha Macias was given information about Medicaid Managed Care team care coordination services as a part of their [facility] Medicaid benefit. Tasha Macias verbally consented to engagement with the [facility] Managed Care team.   If you are experiencing a medical emergency, please call 911 or report to your local emergency department or urgent care.   If you have a non-emergency medical problem during routine business hours, please contact your provider's office and ask to speak with a nurse.   For questions related to your [facility] health plan, please call: [phone] or go here:[email or web address]  If you would like to schedule transportation through your [facility] plan, please call the following number at least 2 days in advance of your appointment: [phone].  Call the [facility] Crisis Line at [phone], at any time, 24 hours a day, 7 days a week. If you are in danger or need immediate medical attention call 911.  If you would like help to quit smoking, call 1-800-QUIT-NOW ((405)416-1883) OR Espaol: 1-855-Djelo-Ya (5-573-220-2542) o para ms informacin haga clic aqu or Text READY to 706-237 to register via text  Following is a copy of your plan of care:  Care Plan : General Social Work (Adult)  Updates made by Tasha Bryant, LCSW since 01/28/2021 12:00 AM     Problem: Depression Identification (Depression)      Long-Range Goal: Depressive Symptoms Identified   Start Date: 01/07/2021  Priority: High  Note:   Timeframe:  Long-Range Goal Priority:  High Start Date:    01/07/21                         Expected End Date:   ongoing                     Follow Up Date 02/26/21  Current barriers:   Chronic Mental Health needs related to Depression and Sleeping Concerns Limited social support and Social Isolation Needs Support, Education, and Care Coordination in order to meet unmet mental health needs. Clinical Goal(s):  demonstrate a reduction in symptoms related to :Depression: depressed mood loss of energy/fatigue disturbed sleep  verbalize understanding of plan for management of Depression   Inter-disciplinary care team collaboration (see longitudinal plan of care) Patient Goals/Self-Care Activities: Over the next 120 days Call [facility] to schedule your counseling appointment. Call [facility] to follow up on referral placed on 01/07/21  Tasha Macias, BSW, MSW, [employer] Managed Medicaid LCSW [facility]   [facility] [email or web address] Phone: [phone]

## 2021-01-29 ENCOUNTER — Other Ambulatory Visit: Payer: Self-pay

## 2021-01-29 NOTE — Patient Outreach (Signed)
Medicaid Managed Care Social Work Note  01/29/2021 Name:  Tasha Macias MRN:  [id number] DOB:  1985-09-08  Tasha Macias is an 35 y.o. year old female who is a primary patient of Patient, No Pcp Per (Inactive).  The Medicaid Managed Care Coordination team was consulted for assistance with:  Community Resources   Tasha Macias was given information about [employer] team services today. Tasha Macias Patient agreed to services and verbal consent obtained.  Engaged with patient  for by telephone forinitial visit in response to referral for case management and/or care coordination services.   Assessments/Interventions:  Review of past medical history, allergies, medications, health status, including review of consultants reports, laboratory and other test data, was performed as part of comprehensive evaluation and provision of chronic care management services.  SDOH: (Social Determinant of Health) assessments and interventions performed:   Advanced Directives Status:  Not addressed in this encounter.  Care Plan                 No Known Allergies  Medications Reviewed Today     Reviewed by Danie Chandler, RN (Registered Nurse) on 01/06/21 at [phone]  Med List Status: <None>   Medication Order Taking? Sig Documenting Provider Last Dose Status Informant  acetaminophen (TYLENOL) 500 MG tablet [id number] No Take 2 tablets (1,000 mg total) by mouth every 6 (six) hours as needed for mild pain or moderate pain.  Patient not taking: Reported on 11/12/2020   Gunnar Bulla, CNM Not Taking Active   benzocaine-Menthol (DERMOPLAST) 20-0.5 % AERO [id number] No Apply 1 application topically as needed for irritation (perineal discomfort).  Patient not taking: Reported on 11/12/2020   Gunnar Bulla, CNM Not Taking Active   ibuprofen (ADVIL) 600 MG tablet [id number] No Take 1 tablet (600 mg total) by mouth every 6 (six) hours.  Patient not taking: Reported on  11/12/2020   Gunnar Bulla, CNM Not Taking Active   Iron-FA-B Cmp-C-Biot-Probiotic (FUSION PLUS) CAPS [id number] No Take 1 tablet by mouth daily.  Patient not taking: Reported on 12/12/2020   Doreene Burke, CNM Not Taking Active   Prenatal Vit-Fe Fumarate-FA (PRENATAL MULTIVITAMIN) TABS tablet [id number] No Take 1 tablet by mouth daily at 12 noon.  Patient not taking: Reported on 11/12/2020   [provider] Not Taking Active   sertraline (ZOLOFT) 50 MG tablet [id number] No Take 1 tablet (50 mg total) by mouth daily.  Patient not taking: Reported on 11/12/2020   Doreene Burke, CNM Not Taking Active   witch hazel-glycerin (TUCKS) pad [id number] No Apply 1 application topically as needed for hemorrhoids.  Patient not taking: Reported on 11/12/2020   Gunnar Bulla, CNM Not Taking Active             Patient Active Problem List   Diagnosis Date Noted   History of postpartum depression 12/12/2019   History of pre-eclampsia 08/28/2018   Abnormal pap 01/10/2012    Conditions to be addressed/monitored per PCP order:   community resources  Care Plan : General Plan of Care (Adult)  Updates made by Shaune Leeks since 01/29/2021 12:00 AM     Problem: Health Promotion or Disease Self-Management (General Plan of Care)   Priority: High  Onset Date: 03/12/2020     Long-Range Goal: Self-Management Plan Developed   Start Date: 03/12/2020  Expected End Date: 02/12/2021  Recent Progress: Not on track  Priority: High  Note:   Current Barriers:  Patient [redacted] weeks  gestation with UTI symptoms  fatigue Update 11/12/20:  Patient delivered 07/08/20-SVD-no complaints.  Needs PCP. 12/12/20:  Patient states she does experience some anxiety/depression, but is better-needs LCSW appointment-also needs PCP information-did not receive. 01/06/21:  Patient still in need of PCP resources, also needs dental resources-unsuccessful attempt 12/18/20.  Anxiety/depression-has appointment  with LCSW 01/07/21.  Nurse Case Manager Clinical Goal(s):  Over the next 90 days, patient will work with provider to address needs related to pregnancy-completed. Over the next 90 days, patient will attend all scheduled medical appointments: Over the next 30 days, patient will meet with LCSW. Over the next 90 days, patient will establish with a PCP, dentist  Interventions:  Inter-disciplinary care team collaboration (see longitudinal plan of care Reviewed medications with patient. Discussed plans with patient for ongoing care management follow up and provided patient with direct contact information for care management team Reviewed scheduled/upcoming provider appointments. Collaboration with LCSW concerning anxiety/depression LCSW referral for anxiety/depression. Collaboration with BSW for PCP, dentist BSW referral for PCP list, dental resources. 12/12/20:  Patient states she has not received information yet-will refer again. 01/06/21:  Patient needs PCP and dental information-unsuccessful attempt 12/18/20-will follow up. 01/29/21: BSW contacted patient regarding PCP/Dental and eviction resources. Patient stated she would like resources to be sent to her via mychart. BSW provided patient with resources for dental, PCP, and [place] [employer] to end homelessness. Patient stated she has already contacted legal Aid and has already worked with [employer]. No other resources are needed at this time Patient Goals/Self-Care Activities Over the next 90 days, patient will:  -Patient will  Self administers medications as prescribed Attends all scheduled provider appointments Calls pharmacy for medication refills Calls provider office for new concerns or questions  Follow Up Plan: The Managed Medicaid care management team will reach out to the patient again over the next 30 days.  The patient has been provided with contact information for the Managed Medicaid care management team and has  been advised to call with any health related questions or concerns.         Follow up:  Patient agrees to Care Plan and Follow-up.  Plan: The Managed Medicaid care management team will reach out to the patient again over the next 30 days.  Date/time of next scheduled Social Work care management/care coordination outreach:  03/02/21  Gus Puma, Kenard Gower, [facility]   [facility]  High Risk Managed Medicaid Team  [phone]

## 2021-01-29 NOTE — Patient Instructions (Signed)
Visit Information  [name] was given information about Medicaid Managed Care team care coordination services as a part of their [facility] Medicaid benefit. Tasha Macias verbally consented to engagement with the [facility] Managed Care team.   If you are experiencing a medical emergency, please call 911 or report to your local emergency department or urgent care.   If you have a non-emergency medical problem during routine business hours, please contact your provider's office and ask to speak with a nurse.   For questions related to your [facility] health plan, please call: [phone] or go here:[email or web address]  If you would like to schedule transportation through your [facility] plan, please call the following number at least 2 days in advance of your appointment: [phone].  Call the [facility] Crisis Line at [phone], at any time, 24 hours a day, 7 days a week. If you are in danger or need immediate medical attention call 911.  If you would like help to quit smoking, call 1-800-QUIT-NOW (713-135-1074) OR Espaol: 1-855-Djelo-Ya (2-595-638-7564) o para ms informacin haga clic aqu or Text READY to 332-951 to register via text  Tasha Macias - following are the goals we discussed in your visit today:   Goals Addressed   None     Social Worker will follow up with patient in 30 days .   Tasha Macias, BSW, [place] [facility]   [place]  High Risk Managed Medicaid Team  [phone]   Following is a copy of your plan of care:  Care Plan : General Plan of Care (Adult)  Updates made by Shaune Leeks since 01/29/2021 12:00 AM     Problem: Health Promotion or Disease Self-Management (General Plan of Care)   Priority: High  Onset Date: 03/12/2020     Long-Range Goal: Self-Management Plan Developed   Start Date: 03/12/2020  Expected End Date: 02/12/2021  Recent Progress: Not on track  Priority: High  Note:   Current  Barriers:  Patient [redacted] weeks gestation with UTI symptoms  fatigue Update 11/12/20:  Patient delivered 07/08/20-SVD-no complaints.  Needs PCP. 12/12/20:  Patient states she does experience some anxiety/depression, but is better-needs LCSW appointment-also needs PCP information-did not receive. 01/06/21:  Patient still in need of PCP resources, also needs dental resources-unsuccessful attempt 12/18/20.  Anxiety/depression-has appointment with LCSW 01/07/21.  Nurse Case Manager Clinical Goal(s):  Over the next 90 days, patient will work with provider to address needs related to pregnancy-completed. Over the next 90 days, patient will attend all scheduled medical appointments: Over the next 30 days, patient will meet with LCSW. Over the next 90 days, patient will establish with a PCP, dentist  Interventions:  Inter-disciplinary care team collaboration (see longitudinal plan of care Reviewed medications with patient. Discussed plans with patient for ongoing care management follow up and provided patient with direct contact information for care management team Reviewed scheduled/upcoming provider appointments. Collaboration with LCSW concerning anxiety/depression LCSW referral for anxiety/depression. Collaboration with BSW for PCP, dentist BSW referral for PCP list, dental resources. 12/12/20:  Patient states she has not received information yet-will refer again. 01/06/21:  Patient needs PCP and dental information-unsuccessful attempt 12/18/20-will follow up. 01/29/21: BSW contacted patient regarding PCP/Dental and eviction resources. Patient stated she would like resources to be sent to her via mychart. BSW provided patient with resources for dental, PCP, and [place] [employer] to end homelessness. Patient stated she has already contacted legal Aid and has already worked with [employer]. No other resources are needed  at this time Patient Goals/Self-Care Activities Over the next 90 days,  patient will:  -Patient will  Self administers medications as prescribed Attends all scheduled provider appointments Calls pharmacy for medication refills Calls provider office for new concerns or questions  Follow Up Plan: The Managed Medicaid care management team will reach out to the patient again over the next 30 days.  The patient has been provided with contact information for the Managed Medicaid care management team and has been advised to call with any health related questions or concerns.

## 2021-02-05 ENCOUNTER — Other Ambulatory Visit: Payer: Self-pay | Admitting: Obstetrics and Gynecology

## 2021-02-05 ENCOUNTER — Other Ambulatory Visit: Payer: Self-pay

## 2021-02-05 NOTE — Patient Outreach (Signed)
Medicaid Managed Care   Nurse Care Manager Note  02/05/2021 Name:  Tasha Macias MRN:  [id number] DOB:  1985-11-28  Tasha Macias is an 35 y.o. year old female who is a primary patient of Patient, No Pcp Per (Inactive).  The [facility] Managed Care Coordination team was consulted for assistance with:    Chronic healthcare management needs.  Tasha Macias was given information about Medicaid Managed Care Coordination team services today. Tasha Macias Patient agreed to services and verbal consent obtained.  Engaged with patient by telephone for follow up visit in response to provider referral for case management and/or care coordination services.   Assessments/Interventions:  Review of past medical history, allergies, medications, health status, including review of consultants reports, laboratory and other test data, was performed as part of comprehensive evaluation and provision of chronic care management services.  SDOH (Social Determinants of Health) assessments and interventions performed: SDOH Interventions    Flowsheet Row Most Recent Value  SDOH Interventions   Food Insecurity Interventions Intervention Not Indicated  Transportation Interventions Intervention Not Indicated       Care Plan  No Known Allergies  Medications Reviewed Today     Reviewed by Danie Chandler, RN (Registered Nurse) on 01/06/21 at [phone]  Med List Status: <None>   Medication Order Taking? Sig Documenting Provider Last Dose Status Informant  acetaminophen (TYLENOL) 500 MG tablet [id number] No Take 2 tablets (1,000 mg total) by mouth every 6 (six) hours as needed for mild pain or moderate pain.  Patient not taking: Reported on 11/12/2020   Gunnar Bulla, CNM Not Taking Active   benzocaine-Menthol (DERMOPLAST) 20-0.5 % AERO [id number] No Apply 1 application topically as needed for irritation (perineal discomfort).  Patient not taking: Reported on 11/12/2020   Gunnar Bulla, CNM Not  Taking Active   ibuprofen (ADVIL) 600 MG tablet [id number] No Take 1 tablet (600 mg total) by mouth every 6 (six) hours.  Patient not taking: Reported on 11/12/2020   Gunnar Bulla, CNM Not Taking Active   Iron-FA-B Cmp-C-Biot-Probiotic (FUSION PLUS) CAPS [id number] No Take 1 tablet by mouth daily.  Patient not taking: Reported on 12/12/2020   Doreene Burke, CNM Not Taking Active   Prenatal Vit-Fe Fumarate-FA (PRENATAL MULTIVITAMIN) TABS tablet [id number] No Take 1 tablet by mouth daily at 12 noon.  Patient not taking: Reported on 11/12/2020   [provider] Not Taking Active   sertraline (ZOLOFT) 50 MG tablet [id number] No Take 1 tablet (50 mg total) by mouth daily.  Patient not taking: Reported on 11/12/2020   Doreene Burke, CNM Not Taking Active   witch hazel-glycerin (TUCKS) pad [id number] No Apply 1 application topically as needed for hemorrhoids.  Patient not taking: Reported on 11/12/2020   Gunnar Bulla, CNM Not Taking Active             Patient Active Problem List   Diagnosis Date Noted   History of postpartum depression 12/12/2019   History of pre-eclampsia 08/28/2018   Abnormal pap 01/10/2012    Conditions to be addressed/monitored per PCP order:   chronic healthcare management needs, anxiety, depression.  Care Plan : General Plan of Care (Adult)  Updates made by Danie Chandler, RN since 02/05/2021 12:00 AM     Problem: Health Promotion or Disease Self-Management (General Plan of Care)   Priority: High  Onset Date: 03/12/2020     Long-Range Goal: Self-Management Plan Developed   Start Date: 03/12/2020  Expected End Date: 05/06/2021  Recent Progress: Not on track  Priority: High  Note:   Current Barriers:  Patient [redacted] weeks gestation with UTI symptoms  fatigue Update 11/12/20:  Patient delivered 07/08/20-SVD-no complaints.  Needs PCP. 12/12/20:  Patient states she does experience some anxiety/depression, but is better-needs LCSW  appointment-also needs PCP information-did not receive. 01/06/21:  Patient still in need of PCP resources, also needs dental resources-unsuccessful attempt 12/18/20.  Anxiety/depression-has appointment with LCSW 01/07/21. 02/05/21:  Patient received dental and PCP resources 01/29/21.  Has completed paperwork for [employer] and will turn in today for counseling and medication management.  Currently facing possible eviction-working with Legal Aid.  Nurse Case Manager Clinical Goal(s):  Over the next 90 days, patient will work with provider to address needs related to pregnancy-completed. Over the next 90 days, patient will attend all scheduled medical appointments: Over the next 30 days, patient will meet with LCSW. Over the next 90 days, patient will establish with a PCP, dentist, counselor  Interventions:  Inter-disciplinary care team collaboration (see longitudinal plan of care Reviewed medications with patient. Discussed plans with patient for ongoing care management follow up and provided patient with direct contact information for care management team Reviewed scheduled/upcoming provider appointments. Collaboration with LCSW concerning anxiety/depression LCSW referral for anxiety/depression. Collaboration with BSW for PCP, dentist BSW referral for PCP list, dental resources. 12/12/20:  Patient states she has not received information yet-will refer again. 01/06/21:  Patient needs PCP and dental information-unsuccessful attempt 12/18/20-will follow up. 01/29/21: BSW contacted patient regarding PCP/Dental and eviction resources. Patient stated she would like resources to be sent to her via mychart. BSW provided patient with resources for dental, PCP, and [place] [employer] to end homelessness. Patient stated she has already contacted legal Aid and has already worked with [employer]. No other resources are needed at this time Patient Goals/Self-Care Activities Over the next 90 days,  patient will:  -Patient will  Self administers medications as prescribed Attends all scheduled provider appointments Calls pharmacy for medication refills Calls provider office for new concerns or questions  Follow Up Plan: The Managed Medicaid care management team will reach out to the patient again over the next 30 days.  The patient has been provided with contact information for the Managed Medicaid care management team and has been advised to call with any health related questions or concerns.    Follow Up:  Patient agrees to Care Plan and Follow-up.  Plan: The Managed Medicaid care management team will reach out to the patient again over the next 30 days. and The  Patient has been provided with contact information for the Managed Medicaid care management team and has been advised to call with any health related questions or concerns.  Date/time of next scheduled RN care management/care coordination outreach:  03/03/21 at 1030

## 2021-02-05 NOTE — Patient Instructions (Signed)
Hi Ms. Barraco, thank you for speaking with me today-I hope you have a great day!!  Ms. Brownstein was given information about Medicaid Managed Care team care coordination services as a part of their [facility] Medicaid benefit. Lawernce Ion Rhinesmith verbally consented to engagement with the [facility] Managed Care team.   If you are experiencing a medical emergency, please call 911 or report to your local emergency department or urgent care.   If you have a non-emergency medical problem during routine business hours, please contact your provider's office and ask to speak with a nurse.   For questions related to your [facility] health plan, please call: [phone] or go here:[email or web address]  If you would like to schedule transportation through your [facility] plan, please call the following number at least 2 days in advance of your appointment: [phone].  Call the [facility] Crisis Line at [phone], at any time, 24 hours a day, 7 days a week. If you are in danger or need immediate medical attention call 911.  If you would like help to quit smoking, call 1-800-QUIT-NOW (631-208-6267) OR Espaol: 1-855-Djelo-Ya (0-354-656-8127) o para ms informacin haga clic aqu or Text READY to 517-001 to register via text  Ms. Coopersmith - following are the goals we discussed in your visit today:   Goals Addressed             This Visit's Progress    Protect My Health       Timeframe:  Long-Range Goal Priority:  High Start Date:       03/12/20                      Expected End Date:  ongoing               Follow Up Date: 03/08/21   - schedule recommended health tests (blood work, mammogram, colonoscopy, pap test) - schedule and keep appointment for annual check-up  02/05/21:  patient has received PCP and dental resources.  Trying to schedule an appt with [employer] right now-just completed paperwork-to turn in today     The patient verbalized understanding of instructions  provided today and declined a print copy of patient instruction materials.   The Managed Medicaid care management team will reach out to the patient again over the next 30 days.  The  Patient has been provided with contact information for the Managed Medicaid care management team and has been advised to call with any health related questions or concerns.   Kathi Der RN, BSN [place]   Triad Engineer, production - Managed Medicaid High Risk [phone].    Following is a copy of your plan of care:  Care Plan : General Plan of Care (Adult)  Updates made by Danie Chandler, RN since 02/05/2021 12:00 AM     Problem: Health Promotion or Disease Self-Management (General Plan of Care)   Priority: High  Onset Date: 03/12/2020     Long-Range Goal: Self-Management Plan Developed   Start Date: 03/12/2020  Expected End Date: 05/06/2021  Recent Progress: Not on track  Priority: High  Note:   Current Barriers:  Patient [redacted] weeks gestation with UTI symptoms  fatigue Update 11/12/20:  Patient delivered 07/08/20-SVD-no complaints.  Needs PCP. 12/12/20:  Patient states she does experience some anxiety/depression, but is better-needs LCSW appointment-also needs PCP information-did not receive. 01/06/21:  Patient still in need of PCP resources, also needs dental resources-unsuccessful attempt 12/18/20.  Anxiety/depression-has appointment with LCSW  01/07/21. 02/05/21:  Patient received dental and PCP resources 01/29/21.  Has completed paperwork for [employer] and will turn in today for counseling and medication management.  Currently facing possible eviction-working with Legal Aid.  Nurse Case Manager Clinical Goal(s):  Over the next 90 days, patient will work with provider to address needs related to pregnancy-completed. Over the next 90 days, patient will attend all scheduled medical appointments: Over the next 30 days, patient will meet with LCSW. Over the next 90 days, patient  will establish with a PCP, dentist, counselor  Interventions:  Inter-disciplinary care team collaboration (see longitudinal plan of care Reviewed medications with patient. Discussed plans with patient for ongoing care management follow up and provided patient with direct contact information for care management team Reviewed scheduled/upcoming provider appointments. Collaboration with LCSW concerning anxiety/depression LCSW referral for anxiety/depression. Collaboration with BSW for PCP, dentist BSW referral for PCP list, dental resources. 12/12/20:  Patient states she has not received information yet-will refer again. 01/06/21:  Patient needs PCP and dental information-unsuccessful attempt 12/18/20-will follow up. 01/29/21: BSW contacted patient regarding PCP/Dental and eviction resources. Patient stated she would like resources to be sent to her via mychart. BSW provided patient with resources for dental, PCP, and [place] [facility] to end homelessness. Patient stated she has already contacted legal Aid and has already worked with [employer]. No other resources are needed at this time Patient Goals/Self-Care Activities Over the next 90 days, patient will:  -Patient will  Self administers medications as prescribed Attends all scheduled provider appointments Calls pharmacy for medication refills Calls provider office for new concerns or questions  Follow Up Plan: The Managed Medicaid care management team will reach out to the patient again over the next 30 days.  The patient has been provided with contact information for the Managed Medicaid care management team and has been advised to call with any health related questions or concerns.

## 2021-02-26 ENCOUNTER — Ambulatory Visit: Payer: Self-pay

## 2021-03-02 ENCOUNTER — Other Ambulatory Visit: Payer: Self-pay

## 2021-03-02 NOTE — Patient Outreach (Signed)
Care Coordination  03/02/2021  Tasha Macias 11/29/1985 [id number]   Medicaid Managed Care   Unsuccessful Outreach Note  03/02/2021 Name: Tasha Macias MRN: [id number] DOB: 1985-04-27  Referred by: Patient, No Pcp Per (Inactive) Reason for referral : High Risk Managed Medicaid (MM Social work Unsuccessful Soil scientist)   An unsuccessful telephone outreach was attempted today. The patient was referred to the case management team for assistance with care management and care coordination.   Follow Up Plan: The care management team will reach out to the patient again over the next 30-45 days.  Marland Kitchenajs

## 2021-03-02 NOTE — Patient Instructions (Signed)
Visit Information  Ms. Antasia Deschamps Nakayama  - as a part of your Medicaid benefit, you are eligible for care management and care coordination services at no cost or copay. I was unable to reach you by phone today but would be happy to help you with your health related needs. Please feel free to call me @[phone]   A member of the Managed Medicaid care management team will reach out to you again over the next 30-45 Mickel Fuchs, [place], [place] Medicaid Team  [phone]

## 2021-03-03 ENCOUNTER — Other Ambulatory Visit: Payer: Self-pay | Admitting: Obstetrics and Gynecology

## 2021-03-03 NOTE — Patient Outreach (Signed)
Care Coordination  03/03/2021  Tasha Macias 1985-02-27 [id number]   Medicaid Managed Care   Unsuccessful Outreach Note  03/03/2021 Name: Tasha Macias MRN: [id number] DOB: 1985/12/29  Referred by: Patient, No Pcp Per (Inactive) Reason for referral : High Risk Managed Medicaid (Unsuccessful telephone outreach)   An unsuccessful telephone outreach was attempted today. The patient was referred to the case management team for assistance with care management and care coordination.   Follow Up Plan: The care management team will reach out to the patient again over the next 7-14 days.   Kathi Der RN, BSN [place]   Triad Engineer, production - Managed Medicaid High Risk [phone]

## 2021-03-03 NOTE — Patient Instructions (Signed)
Hi Ms. Lubell, I am sorry I missed you today, I hope you are doing okay- as a part of your Medicaid benefit, you are eligible for care management and care coordination services at no cost or copay. I was unable to reach you by phone today but would be happy to help you with your health related needs. Please feel free to call me at [phone].  A member of the Managed Medicaid care management team will reach out to you again over the next 7-14 days.   Aida Raider RN, BSN [place]   Triad Curator - Managed Medicaid High Risk [phone]

## 2021-03-03 NOTE — Progress Notes (Deleted)
Psychiatric Initial Adult Assessment   Patient Identification: Jamilynn Whitacre Lindsley MRN:  [id number] Date of Evaluation:  03/03/2021 Referral Source:  Annye Asa*  Chief Complaint:   Visit Diagnosis: No diagnosis found.  History of Present Illness:   Korinna Tat Feltner is a 36 y.o. year old female with a history of post partum depression, who is referred for depression.     Associated Signs/Symptoms: Depression Symptoms:  {DEPRESSION SYMPTOMS:20000} (Hypo) Manic Symptoms:  {BHH MANIC SYMPTOMS:22872} Anxiety Symptoms:  {BHH ANXIETY SYMPTOMS:22873} Psychotic Symptoms:  {BHH PSYCHOTIC SYMPTOMS:22874} PTSD Symptoms: {BHH PTSD SYMPTOMS:22875}  Past Psychiatric History:  Outpatient:  Psychiatry admission:  Previous suicide attempt:  Past trials of medication:  History of violence:    Previous Psychotropic Medications: {YES/NO:21197}  Substance Abuse History in the last 12 months:  {yes no:314532}  Consequences of Substance Abuse: {BHH CONSEQUENCES OF SUBSTANCE ABUSE:22880}  Past Medical History:  Past Medical History:  Diagnosis Date   Preeclampsia 2009    Past Surgical History:  Procedure Laterality Date   DILATION AND CURETTAGE OF UTERUS      Family Psychiatric History: ***  Family History:  Family History  Problem Relation Age of Onset   Autism Son    Hypertension Maternal Grandmother    Breast cancer Neg Hx    Ovarian cancer Neg Hx    Colon cancer Neg Hx     Social History:   Social History   Socioeconomic History   Marital status: Significant Other    Spouse name: Games developer    Number of children: Not on file   Years of education: Not on file   Highest education level: Not on file  Occupational History   Not on file  Tobacco Use   Smoking status: Former    Packs/day: 0.50    Types: Cigarettes   Smokeless tobacco: Never  Vaping Use   Vaping Use: Never used  Substance and Sexual Activity   Alcohol use: Not Currently    Alcohol/week: 2.0  standard drinks    Types: 2 Standard drinks or equivalent per week    Comment: last use 12/10/19   Drug use: Not Currently   Sexual activity: Not Currently    Birth control/protection: None  Other Topics Concern   Not on file  Social History Narrative   Not on file   Social Determinants of Health   Financial Resource Strain: Low Risk    Difficulty of Paying Living Expenses: Not hard at all  Food Insecurity: No Food Insecurity   Worried About Programme researcher, broadcasting/film/video in the Last Year: Never true   Barista in the Last Year: Never true  Transportation Needs: No Transportation Needs   Lack of Transportation (Medical): No   Lack of Transportation (Non-Medical): No  Physical Activity: Inactive   Days of Exercise per Week: 0 days   Minutes of Exercise per Session: 0 min  Stress: Stress Concern Present   Feeling of Stress : Very much  Social Connections: Unknown   Frequency of Communication with Friends and Family: More than three times a week   Frequency of Social Gatherings with Friends and Family: More than three times a week   Attends Religious Services: 1 to 4 times per year   Active Member of [employer] or Organizations: No   Attends Banker Meetings: Never   Marital Status: Not on file    Additional Social History: ***  Allergies:  No Known Allergies  Metabolic Disorder Labs: Lab Results  Component Value Date   HGBA1C 5.0 01/23/2020   No results found for: PROLACTIN No results found for: CHOL, TRIG, HDL, CHOLHDL, VLDL, LDLCALC Lab Results  Component Value Date   TSH 1.020 01/23/2020    Therapeutic Level Labs: No results found for: LITHIUM No results found for: CBMZ No results found for: VALPROATE  Current Medications: Current Outpatient Medications  Medication Sig Dispense Refill   acetaminophen (TYLENOL) 500 MG tablet Take 2 tablets (1,000 mg total) by mouth every 6 (six) hours as needed for mild pain or moderate pain. (Patient not taking: Reported  on 11/12/2020) 60 tablet 0   benzocaine-Menthol (DERMOPLAST) 20-0.5 % AERO Apply 1 application topically as needed for irritation (perineal discomfort). (Patient not taking: Reported on 11/12/2020) 56 g 0   ibuprofen (ADVIL) 600 MG tablet Take 1 tablet (600 mg total) by mouth every 6 (six) hours. (Patient not taking: Reported on 11/12/2020) 30 tablet 0   Iron-FA-B Cmp-C-Biot-Probiotic (FUSION PLUS) CAPS Take 1 tablet by mouth daily. (Patient not taking: Reported on 12/12/2020) 30 capsule 6   Multiple Vitamin (MULTIVITAMIN WITH MINERALS) TABS tablet Take 1 tablet by mouth daily. Takes 2 a day     Prenatal Vit-Fe Fumarate-FA (PRENATAL MULTIVITAMIN) TABS tablet Take 1 tablet by mouth daily at 12 noon. (Patient not taking: Reported on 11/12/2020)     sertraline (ZOLOFT) 50 MG tablet Take 1 tablet (50 mg total) by mouth daily. (Patient not taking: Reported on 11/12/2020) 30 tablet 3   witch hazel-glycerin (TUCKS) pad Apply 1 application topically as needed for hemorrhoids. (Patient not taking: Reported on 11/12/2020) 40 each 12   No current facility-administered medications for this visit.    Musculoskeletal: Strength & Muscle Tone:  N/A Gait & Station:  N/A Patient leans: N/A  Psychiatric Specialty Exam: Review of Systems  unknown if currently breastfeeding.There is no height or weight on file to calculate BMI.  General Appearance: {Appearance:22683}  Eye Contact:  {BHH EYE CONTACT:22684}  Speech:  Clear and Coherent  Volume:  Normal  Mood:  {BHH MOOD:22306}  Affect:  {Affect (PAA):22687}  Thought Process:  Coherent  Orientation:  Full (Time, Place, and Person)  Thought Content:  Logical  Suicidal Thoughts:  {ST/HT (PAA):22692}  Homicidal Thoughts:  {ST/HT (PAA):22692}  Memory:  Immediate;   Good  Judgement:  {Judgement (PAA):22694}  Insight:  {Insight (PAA):22695}  Psychomotor Activity:  Normal  Concentration:  Concentration: Good and Attention Span: Good  Recall:  Good  Fund of  Knowledge:Good  Language: Good  Akathisia:  No  Handed:  Right  AIMS (if indicated):  not done  Assets:  Communication Skills Desire for Improvement  ADL's:  Intact  Cognition: WNL  Sleep:  {BHH GOOD/FAIR/POOR:22877}   Screenings: GAD-7    Flowsheet Row Office Visit from 07/21/2020 in [facility] Routine Prenatal from 05/14/2020 in [facility]  Total GAD-7 Score 5 6 15       PHQ2-9    Flowsheet Row Office Visit from 07/21/2020 in [facility] Routine Prenatal from 05/14/2020 in [facility] Routine Prenatal from 05/01/2020 in [facility] Routine Prenatal from 04/02/2020 in [facility]  PHQ-2 Total Score 0 2 2 2 2   PHQ-9 Total Score 1 11 11 13 11       Flowsheet Row [facility]) [facility]  C-SSRS RISK CATEGORY No Risk  Assessment and Plan:    Plan   The patient demonstrates the following risk factors for suicide: Chronic risk factors for suicide include: {Chronic Risk Factors for EXBMWUX:32440102}. Acute risk factors for suicide include: {Acute Risk Factors for VOZDGUY:40347425}. Protective factors for this patient include: {Protective Factors for Suicide ZDGL:87564332}. Considering these factors, the overall suicide risk at this point appears to be {Desc; low/moderate/high:110033}. Patient {ACTION; IS/IS RJJ:88416606} appropriate for outpatient follow up.      Neysa Hotter, MD 1/24/20233:50 PM

## 2021-03-04 ENCOUNTER — Telehealth: Payer: Self-pay

## 2021-03-04 NOTE — Telephone Encounter (Signed)
.. °  Medicaid Managed Care   Unsuccessful Outreach Note  03/04/2021 Name: Tasha Macias MRN: [id number] DOB: 1985-07-03  Referred by: Patient, No Pcp Per (Inactive) Reason for referral : High Risk Managed Medicaid (I called the patient today to get her rescheduled with the MM RNCM and the MM BSW. I left my name and number on her VM.)   An unsuccessful telephone outreach was attempted today. The patient was referred to the case management team for assistance with care management and care coordination.   Follow Up Plan: The care management team will reach out to the patient again over the next 7 days.    Weston Settle Care Guide, High Risk Medicaid Managed Care Embedded Care Coordination [facility]   [facility]

## 2021-03-05 ENCOUNTER — Ambulatory Visit: Payer: Medicaid Other | Admitting: Psychiatry
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
## 2021-03-06 ENCOUNTER — Other Ambulatory Visit: Payer: Self-pay

## 2021-03-06 ENCOUNTER — Encounter: Payer: Self-pay | Admitting: Psychiatry

## 2021-03-06 ENCOUNTER — Telehealth (INDEPENDENT_AMBULATORY_CARE_PROVIDER_SITE_OTHER): Payer: Medicaid Other | Admitting: Psychiatry

## 2021-03-06 DIAGNOSIS — F321 Major depressive disorder, single episode, moderate: Secondary | ICD-10-CM | POA: Diagnosis not present

## 2021-03-06 DIAGNOSIS — F4310 Post-traumatic stress disorder, unspecified: Secondary | ICD-10-CM | POA: Diagnosis not present

## 2021-03-06 MED ORDER — ESCITALOPRAM OXALATE 10 MG PO TABS: ORAL_TABLET | ORAL | 0 refills | Status: DC

## 2021-03-06 NOTE — Patient Instructions (Addendum)
Start lexapro 5 mg daily for one week, then 10 mg daily  Check labs (TSH, CBC) Referral to therapy  Next appointment: 2/24 at 11 AM, video  CONTACT INFORMATION  What to do if you need to get in touch with someone regarding a psychiatric issue:  1. EMERGENCY: For psychiatric emergencies (if you are suicidal or if there are any other safety issues) call 911 and/or go to your nearest Emergency Room immediately.   2. IF YOU NEED SOMEONE TO TALK TO RIGHT NOW: Given my clinical responsibilities, I may not be able to speak with you over the phone for a prolonged period of time.  A. You may always call The National Suicide Prevention Lifeline at 1-800-273-TALK (845)375-5913).

## 2021-03-10 ENCOUNTER — Other Ambulatory Visit: Payer: Self-pay

## 2021-03-10 NOTE — Patient Outreach (Signed)
Care Coordination  03/10/2021  Melissia Lahman Bosch 1985/09/29 [id number]   Medicaid Managed Care   Unsuccessful Outreach Note  03/10/2021 Name: Tasha Macias MRN: [id number] DOB: 06-Aug-1985  Referred by: Patient, No Pcp Per (Inactive) Reason for referral : High Risk Managed Medicaid (MM social Work unsuccessful telephone outreach)   An unsuccessful telephone outreach was attempted today. The patient was referred to the case management team for assistance with care management and care coordination.   Follow Up Plan: The patient has been provided with contact information for the care management team and has been advised to call with any health related questions or concerns.   Gus Puma, BSW, [place] [facility]   [place]  High Risk Managed Medicaid Team  [phone]

## 2021-03-10 NOTE — Patient Instructions (Signed)
Visit Information  [name]  - as a part of your Medicaid benefit, you are eligible for care management and care coordination services at no cost or copay. I was unable to reach you by phone today but would be happy to help you with your health related needs. Please feel free to call me @ [phone]     Gus Puma, BSW, [facility]   [facility]  High Risk Managed Medicaid Team  [phone]

## 2021-03-12 ENCOUNTER — Other Ambulatory Visit: Payer: Self-pay | Admitting: Obstetrics and Gynecology

## 2021-03-12 ENCOUNTER — Other Ambulatory Visit: Payer: Self-pay

## 2021-03-12 DIAGNOSIS — Z8759 Personal history of other complications of pregnancy, childbirth and the puerperium: Secondary | ICD-10-CM

## 2021-03-12 DIAGNOSIS — Z8659 Personal history of other mental and behavioral disorders: Secondary | ICD-10-CM
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
## 2021-03-12 NOTE — Patient Instructions (Signed)
Hi Tasha Macias, thank you for speaking with me-I hope you have a good afternoon.  Tasha Macias was given information about Medicaid Managed Care team care coordination services as a part of their [facility] Medicaid benefit. Tasha Macias verbally consented to engagement with the [facility] Managed Care team.   If you are experiencing a medical emergency, please call 911 or report to your local emergency department or urgent care.   If you have a non-emergency medical problem during routine business hours, please contact your provider's office and ask to speak with a nurse.   For questions related to your [facility] health plan, please call: [phone] or go here:[email or web address]  If you would like to schedule transportation through your [facility] plan, please call the following number at least 2 days in advance of your appointment: [phone].  Call the [facility] Crisis Line at [phone], at any time, 24 hours a day, 7 days a week. If you are in danger or need immediate medical attention call 911.  If you would like help to quit smoking, call 1-800-QUIT-NOW (765 721 6554) OR Espaol: 1-855-Djelo-Ya (0-737-106-2694) o para ms informacin haga clic aqu or Text READY to 854-627 to register via text  Tasha Macias - following are the goals we discussed in your visit today:   Goals Addressed             This Visit's Progress    Protect My Health       Timeframe:  Long-Range Goal Priority:  High Start Date:       03/12/20                      Expected End Date:  ongoing               Follow Up Date: 03/08/21   - schedule recommended health tests (blood work, mammogram, colonoscopy, pap test) - schedule and keep appointment for annual check-up  03/12/21:  patient currently seeing counselor/therapist 1-2 times a month, started on Lexapro-feeling okay today.  Needs dental resources.   The patient verbalized understanding of instructions provided today and  agreed to receive a mailed copy of patient instruction and/or educational materials.  The Managed Medicaid care management team will reach out to the patient again over the next 30 days.  The  Patient has been provided with contact information for the Managed Medicaid care management team and has been advised to call with any health related questions or concerns.   Kathi Der RN, BSN [place]   [employer], production - Managed Medicaid High Risk [phone].  Following is a copy of your plan of care:  Care Plan : General Plan of Care (Adult)  Updates made by Danie Chandler, RN since 03/12/2021 12:00 AM     Problem: Health Promotion or Disease Self-Management (General Plan of Care)   Priority: High  Onset Date: 03/12/2020     Long-Range Goal: Self-Management Plan Developed   Start Date: 03/12/2020  Expected End Date: 05/06/2021  Recent Progress: Not on track  Priority: High  Note:   Current Barriers:  Patient started on Lexapro-feeling okay today-no SI.  Continues to work with Legal Aid regarding possible eviction-to have home reinspected.  In need of dental resources.  Nurse Case Manager Clinical Goal(s):  Over the next 90 days, patient will work with provider to address needs related to pregnancy-completed. Over the next 90 days, patient will attend all scheduled medical appointments: Over the next 30  days, patient will meet with LCSW. Over the next 90 days, patient will establish with a PCP, dentist, counselor  Interventions:  Inter-disciplinary care team collaboration (see longitudinal plan of care Reviewed medications with patient. Discussed plans with patient for ongoing care management follow up and provided patient with direct contact information for care management team Reviewed scheduled/upcoming provider appointments. Collaboration with LCSW concerning anxiety/depression LCSW referral for anxiety/depression. Collaboration with Care Guide  for dental resources Care Guide referral for  dental resources.  Patient Goals/Self-Care Activities Over the next 90 days, patient will:  -Patient will  Self administers medications as prescribed Attends all scheduled provider appointments Calls pharmacy for medication refills Calls provider office for new concerns or questions  Follow Up Plan: The Managed Medicaid care management team will reach out to the patient again over the next 30 days.  The patient has been provided with contact information for the Managed Medicaid care management team and has been advised to call with any health related questions or concerns.

## 2021-03-13 ENCOUNTER — Other Ambulatory Visit: Payer: Self-pay

## 2021-03-13 ENCOUNTER — Other Ambulatory Visit: Payer: Self-pay | Admitting: Obstetrics and Gynecology

## 2021-03-13 DIAGNOSIS — Z8659 Personal history of other mental and behavioral disorders: Secondary | ICD-10-CM

## 2021-03-13 NOTE — Patient Outreach (Signed)
Care Coordination  03/13/2021  Tasha Macias 1985/03/19 [id number]  Care Guide referral for dental resources.  Kathi Der RN, BSN [place]   [employer], production - Managed Medicaid High Risk [phone].

## 2021-03-18 ENCOUNTER — Other Ambulatory Visit: Payer: Self-pay

## 2021-03-18 ENCOUNTER — Telehealth: Payer: Self-pay | Admitting: *Deleted

## 2021-03-18 NOTE — Patient Outreach (Signed)
Medicaid Managed Care Social Work Note  03/18/2021 Name:  Tasha Macias MRN:  [id number] DOB:  09/30/1985  Tasha Macias is an 36 y.o. year old female who is a primary patient of Patient, No Pcp Per (Inactive).  The Medicaid Managed Care Coordination team was consulted for assistance with:  Community Resources   [name] was given information about [employer] team services today. Tasha Macias Patient agreed to services and verbal consent obtained.  Engaged with patient  for by telephone forfollow up visit in response to referral for case management and/or care coordination services.   Assessments/Interventions:  Review of past medical history, allergies, medications, health status, including review of consultants reports, laboratory and other test data, was performed as part of comprehensive evaluation and provision of chronic care management services.  SDOH: (Social Determinant of Health) assessments and interventions performed: Macias completed follow up with patient. She stated she did receive the dental resources from Tasha Macias this morning. Patient states she is still going through trying to get re certified for her housing. Patient stated she would like an appointment with Tasha Macias once she returns. No other resources needed at this time.   Advanced Directives Status:  Not addressed in this encounter.  Care Plan                 No Known Allergies  Medications Reviewed Today     Reviewed by Tasha Chandler, RN (Registered Nurse) on 03/12/21 at 1331  Med List Status: <None>   Medication Order Taking? Sig Documenting Provider Last Dose Status Informant  escitalopram (LEXAPRO) 10 MG tablet [id number] Yes 5 mg daily for one week, then 10 mg daily Tasha Hotter, MD Taking Active             Patient Active Problem List   Diagnosis Date Noted   History of postpartum depression 12/12/2019   History of pre-eclampsia 08/28/2018   Abnormal pap 01/10/2012     Conditions to be addressed/monitored per PCP order:      Care Plan : General Social Work (Adult)  Updates made by Tasha Macias since 03/18/2021 12:00 AM     Problem: Depression Identification (Depression)      Long-Range Goal: Depressive Symptoms Identified   Start Date: 01/07/2021  Priority: High  Note:   Timeframe:  Long-Range Goal Priority:  High Start Date:    01/07/21                         Expected End Date:   ongoing                     Follow Up Date 02/26/21  Current barriers:   Chronic Mental Health needs related to Depression and Sleeping Concerns Limited social support and Social Isolation Needs Support, Education, and Care Coordination in order to meet unmet mental health needs. Clinical Goal(s): demonstrate a reduction in symptoms related to :Depression: depressed mood loss of energy/fatigue disturbed sleep  verbalize understanding of plan for management of Depression   Clinical Interventions:  Assessed patient's previous and current treatment, coping skills, support system and barriers to care  Motivational Interviewing employed Depression screen reviewed  Mindfulness or Relaxation training provided Active listening / Reflection utilized  Emotional Support Provided Behavioral Activation reviewed Provided brief CBT  Quality of sleep assessed & Sleep Hygiene techniques promoted  Caregiver stress acknowledged  Participation in counseling encouraged  Increase in actives / exercise encouraged  Verbalization of feelings encouraged  Crisis Resource Education / information provided  Discussed Guardianship and reviewed process  Discussed Health Care Power of Attorney  Discussed referral for psychiatry  Made referral to Tasha Macias  ; Review resources, discussed options and provided patient information about  Options for mental health treatment based on need and insurance Patient lives alone with her 5 children. She is experiencing signs of depression and  insomnia. She reports that she feels fatigue on most days because of her lack of sleep as she has a new born baby that wakes up several times throughout the night. Patient is interested in counseling and medication management. Tasha Macias will make referral to Tasha Macias and ask if they can provide virtual sessions because of her limited childcare.  Patient denies any current SI/HI.Update 01/28/21- Patient reports that she has heard back from Tasha Macias and received their intake paperwork. She will need to complete this before scheduling her medication management and counseling appointment. Patient reports that she will complete paperwork as soon as she can but she just found out that she did not pass inspection and will have to turn in her keys on 02/08/21. Patient was advised to contact PCP (PCP chosen) to schedule an appointment. Patient was advised to return call back to [facility] Macias. Information sent to her by patient message securely by My Chart. Patient was provided emotional support during the time. [facility] Macias will update Tasha Macias on 01/28/21. Patient reports wanting a list of primary care providers and dental resources. [facility] Macias will update [facility] RNCM and Tasha Macias.  03/18/21: Macias completed follow up with patient. She stated she did receive the dental resources from Tasha Macias this morning. Patient states she is still going through trying to get re certified for her housing. Patient stated she would like an appointment with Tasha Macias once she returns. No other resources needed at this time.  Inter-disciplinary care team collaboration (see longitudinal plan of care) Patient Goals/Self-Care Activities: Over the next 120 days Call Tasha Macias to schedule your counseling appointment. Call Tasha Macias to follow up on referral placed on 01/07/21        Follow up:  Patient agrees to Care Plan and Follow-up.  Plan: The Managed Medicaid care management team will reach out to the patient again over the next 30-45 days.    Tasha Macias, Macias,  [facility]   [place]  High Risk Managed Medicaid Team  [phone]

## 2021-03-18 NOTE — Telephone Encounter (Signed)
° °  Telephone encounter was:  Successful.  03/18/2021 Name: Tasha Macias MRN: [id number] DOB: Jul 02, 1985  Tasha Macias is a 36 y.o. year old female who is a primary care patient of Patient, No Pcp Per (Inactive) . The community resource team was consulted for assistance with  dental providers   Care guide performed the following interventions: Patient provided with information about care guide support team and interviewed to confirm resource needs.Patient requested listing of dental providers taking medicaid , asked that I email her the list also told her where to find provider information on card and website  Follow Up Plan:  No further follow up planned at this time. The patient has been provided with needed resources.  Alois Cliche -[facility] Guide , Embedded Care Coordination [facility], Care Management  [phone] [address] , [place] [address] Email : [email or web address]

## 2021-03-18 NOTE — Patient Instructions (Signed)
Visit Information  [name] was given information about Medicaid Managed Care team care coordination services as a part of their [facility] Medicaid benefit. Tasha Macias verbally consented to engagement with the [facility] Managed Care team.   If you are experiencing a medical emergency, please call 911 or report to your local emergency department or urgent care.   If you have a non-emergency medical problem during routine business hours, please contact your provider's office and ask to speak with a nurse.   For questions related to your [facility] health plan, please call: [phone] or go here:[email or web address]  If you would like to schedule transportation through your [facility] plan, please call the following number at least 2 days in advance of your appointment: [phone].  Call the [place] at [phone], at any time, 24 hours a day, 7 days a week. If you are in danger or need immediate medical attention call 911.  If you would like help to quit smoking, call 1-800-QUIT-NOW 213-190-9145) OR Espaol: 1-855-Djelo-Ya HD:1601594) o para ms informacin haga clic aqu or Text READY to 200-400 to register via text  [name] - following are the goals we discussed in your visit today:   Goals Addressed   None     The  Patient                                              has been provided with contact information for the Managed Medicaid care management team and has been advised to call with any health related questions or concerns.   Tasha Macias, BSW, [place]  High Risk Managed Medicaid Team  [phone]   Following is a copy of your plan of care:  Care Plan : General Social Work (Adult)  Updates made by Tasha Macias since 03/18/2021 12:00 AM     Problem: Depression Identification (Depression)      Long-Range Goal: Depressive Symptoms Identified   Start Date: 01/07/2021   Priority: High  Note:   Timeframe:  Long-Range Goal Priority:  High Start Date:    01/07/21                         Expected End Date:   ongoing                     Follow Up Date 02/26/21  Current barriers:   Chronic Mental Health needs related to Depression and Sleeping Concerns Limited social support and Social Isolation Needs Support, Education, and Care Coordination in order to meet unmet mental health needs. Clinical Goal(s): demonstrate a reduction in symptoms related to :Depression: depressed mood loss of energy/fatigue disturbed sleep  verbalize understanding of plan for management of Depression   Clinical Interventions:  Assessed patient's previous and current treatment, coping skills, support system and barriers to care  Motivational Interviewing employed Depression screen reviewed  Mindfulness or Relaxation training provided Active listening / Reflection utilized  Emotional Support Provided Behavioral Activation reviewed Provided brief CBT  Quality of sleep assessed & Sleep Hygiene techniques promoted  Caregiver stress acknowledged  Participation in counseling encouraged  Increase in actives / exercise encouraged  Verbalization of feelings encouraged  Crisis Resource Education / information provided  Discussed Guardianship and reviewed process  [place] of  Attorney  Discussed referral for psychiatry  Made referral to [facility]  ; Review resources, discussed options and provided patient information about  Options for mental health treatment based on need and insurance Patient lives alone with her 5 children. She is experiencing signs of depression and insomnia. She reports that she feels fatigue on most days because of her lack of sleep as she has a new born baby that wakes up several times throughout the night. Patient is interested in counseling and medication management. [facility] LCSW will make referral to [facility] and ask if they can provide virtual sessions  because of her limited childcare.  Patient denies any current SI/HI.Update 01/28/21- Patient reports that she has heard back from [facility] and received their intake paperwork. She will need to complete this before scheduling her medication management and counseling appointment. Patient reports that she will complete paperwork as soon as she can but she just found out that she did not pass inspection and will have to turn in her keys on 02/08/21. Patient was advised to contact PCP (PCP chosen) to schedule an appointment. Patient was advised to return call back to [facility] BSW. Information sent to her by patient message securely by My Chart. Patient was provided emotional support during the time. [facility] LCSW will update [place] BSW on 01/28/21. Patient reports wanting a list of primary care providers and dental resources. [facility] LCSW will update [facility] RNCM and [place] BSW.  03/18/21: BSW completed follow up with patient. She stated she did receive the dental resources from [employer] this morning. Patient states she is still going through trying to get re certified for her housing. Patient stated she would like an appointment with Tasha Macias once she returns. No other resources needed at this time.  Inter-disciplinary care team collaboration (see longitudinal plan of care) Patient Goals/Self-Care Activities: Over the next 120 days Call [facility] to schedule your counseling appointment. Call [facility] to follow up on referral placed on 01/07/21
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
## 2021-04-03 ENCOUNTER — Other Ambulatory Visit: Payer: Self-pay

## 2021-04-03 ENCOUNTER — Telehealth: Payer: Self-pay | Admitting: Psychiatry

## 2021-04-03 ENCOUNTER — Telehealth: Payer: Medicaid Other | Admitting: Psychiatry

## 2021-04-03 NOTE — Telephone Encounter (Signed)
Sent link for video visit through Epic. Patient did not sign in. Called the patient for appointment scheduled today. The patient did not answer the phone. Voice message was full.  

## 2021-04-07 ENCOUNTER — Telehealth: Payer: Self-pay | Admitting: Licensed Clinical Social Worker

## 2021-04-09 ENCOUNTER — Other Ambulatory Visit: Payer: Self-pay | Admitting: Obstetrics and Gynecology

## 2021-04-09 NOTE — Patient Instructions (Signed)
Hi Ms. Puskarich, I am sorry I missed you today, I hope you are doing okay - as a part of your Medicaid benefit, you are eligible for care management and care coordination services at no cost or copay. I was unable to reach you by phone today but would be happy to help you with your health related needs. Please feel free to call me at [phone]. ? ?A member of the Managed Medicaid care management team will reach out to you again over the next 7-14 working days.  ? ?Kathi Der RN, BSN ?[place]  [facility] ?Care Management Coordinator - Managed Medicaid High Risk ?[phone]  ?

## 2021-04-09 NOTE — Patient Outreach (Signed)
Care Coordination ? ?04/09/2021 ? ?Tasha Macias ?[birth year] ?[id number] ? ? ?Medicaid Managed Care  ? ?Unsuccessful Outreach Note ? ?04/09/2021 ?Name: Tasha Macias MRN: [id number] DOB: [birth year] ? ?Referred by: Patient, No Pcp Per (Inactive) ?Reason for referral : High Risk Managed Medicaid (Unsuccessful telephone outreach) ? ? ?An unsuccessful telephone outreach was attempted today. The patient was referred to the case management team for assistance with care management and care coordination.  ? ?Follow Up Plan: The care management team will reach out to the patient again over the next 7-14 working  days.  ? ?Kathi Der RN, BSN ?[place]  [facility] ?Care Management Coordinator - Managed Medicaid High Risk ?[phone] ?  ? ? ?

## 2021-04-14 ENCOUNTER — Telehealth: Payer: Self-pay | Admitting: Licensed Clinical Social Worker

## 2021-04-14 ENCOUNTER — Ambulatory Visit (INDEPENDENT_AMBULATORY_CARE_PROVIDER_SITE_OTHER): Payer: Self-pay | Admitting: Licensed Clinical Social Worker

## 2021-04-14 ENCOUNTER — Other Ambulatory Visit: Payer: Self-pay

## 2021-04-14 DIAGNOSIS — Z91199 Patient's noncompliance with other medical treatment and regimen due to unspecified reason: Secondary | ICD-10-CM

## 2021-04-14 NOTE — Progress Notes (Addendum)
LCSW counselor tried to connect with patient for scheduled appointment via MyChart video text request x 2 and email request; also tried to connect via phone without success. LCSW counselor attempted to leave message unsuccessfully.   ? ?Attempt 1: Text and email: 1:07p ? ?Attempt 2: Text and email: 1:11p ? ?Attempt 3: phone call: 1:16p.  Could not leave message because voice mail box was full.  ?

## 2021-04-14 NOTE — Telephone Encounter (Addendum)
LCSW counselor tried to connect with patient for scheduled appointment via MyChart video text request x 2 and email request; also tried to connect via phone without success. LCSW counselor attempted to leave message unsuccessfully.   ? ?Attempt 1: Text and email: 1:07p ? ?Attempt 2: Text and email: 1:11p ? ?Attempt 3: phone call: 1:16p.  Could not leave message because voice mail box was full.  ?

## 2021-04-22 ENCOUNTER — Telehealth: Payer: Self-pay

## 2021-04-22 NOTE — Telephone Encounter (Signed)
.. ?  Medicaid Managed Care  ? ?Unsuccessful Outreach Note ? ?04/22/2021 ?Name: Tasha Macias MRN: [id number] DOB: [birth year] ? ?Referred by: Patient, No Pcp Per (Inactive) ?Reason for referral : High Risk Managed Medicaid (I called the patient today to get her scheduled with the MM LCSW for a follow up visit. Her VM was full.) ? ? ?An unsuccessful telephone outreach was attempted today. The patient was referred to the case management team for assistance with care management and care coordination.  ? ?Follow Up Plan: The care management team will reach out to the patient again over the next 7 days.  ? ? ? ?Weston Settle ?Care Guide, High Risk Medicaid Managed Care ?Embedded Care Coordination ?[place]  [facility]  ? ? ?

## 2021-04-27 ENCOUNTER — Telehealth: Payer: Self-pay

## 2021-04-27 NOTE — Telephone Encounter (Signed)
.. ?  Medicaid Managed Care  ? ?Unsuccessful Outreach Note ? ?04/27/2021 ?Name: Tasha Macias MRN: [id number] DOB: Aug 22, 1985 ? ?Referred by: Patient, No Pcp Per (Inactive) ?Reason for referral : High Risk Managed Medicaid (I called the patient today to get her scheduled for a follow up call with the MM LCSW. I was not able to leave a message. Her VM was full.) ? ? ?A second unsuccessful telephone outreach was attempted today. The patient was referred to the case management team for assistance with care management and care coordination.  ? ?Follow Up Plan: The care management team will reach out to the patient again over the next 7 days.  ?Weston Settle ?Care Guide, High Risk Medicaid Managed Care ?Embedded Care Coordination ?[place]  [facility]  ? ? ? ?

## 2021-04-28 ENCOUNTER — Other Ambulatory Visit: Payer: Self-pay | Admitting: Obstetrics and Gynecology

## 2021-04-28 NOTE — Patient Instructions (Signed)
[place], sorry we have missed you - as a part of your Medicaid benefit, you are eligible for care management and care coordination services at no cost or copay. I was unable to reach you by phone today but would be happy to help you with your health related needs. Please feel free to call me at [phone] ? ?Aida Raider RN, BSN ?[facility] ?Care Management Coordinator - Managed Medicaid High Risk ?[phone] ?  ?

## 2021-04-28 NOTE — Patient Outreach (Signed)
Care Coordination ? ?04/28/2021 ? ?[place] ?06/11/1985 ?[id number] ? ? ?Medicaid Managed Care  ? ?Unsuccessful Outreach Note ? ?04/28/2021 ?Name: Tasha Macias MRN: [id number] DOB: May 19, 1985 ? ?Referred by: Patient, No Pcp Per (Inactive) ?Reason for referral : High Risk Managed Medicaid (Unsuccessful telephone outreach) ? ? ?Fourth unsuccessful telephone outreach was attempted today. The patient was referred to the case management team for assistance with care management and care coordination. The patient's primary care provider has been notified of our unsuccessful attempts to make or maintain contact with the patient. The care management team is pleased to engage with this patient at any time in the future should he/she be interested in assistance from the care management team.  ? ?Follow Up Plan: We have been unable to make contact with the patient for follow up. The care management team is available to follow up with the patient after provider conversation with the patient regarding recommendation for care management engagement and subsequent re-referral to the care management team.  ? ?Aida Raider RN, BSN ?[facility] ?Care Management Coordinator - Managed Medicaid High Risk ?[phone] ?  ? ? ?

## 2021-11-23 ENCOUNTER — Encounter: Payer: Self-pay | Admitting: Certified Nurse Midwife

## 2022-08-29 IMAGING — US US MFM OB DETAIL+14 WK
1 series · 14 of 28 positions shown · non-contrast
Comparison: none

[Series 1: us mfm ob detail+14 wk · 14 of 99 slices shown]
[im 4/99]
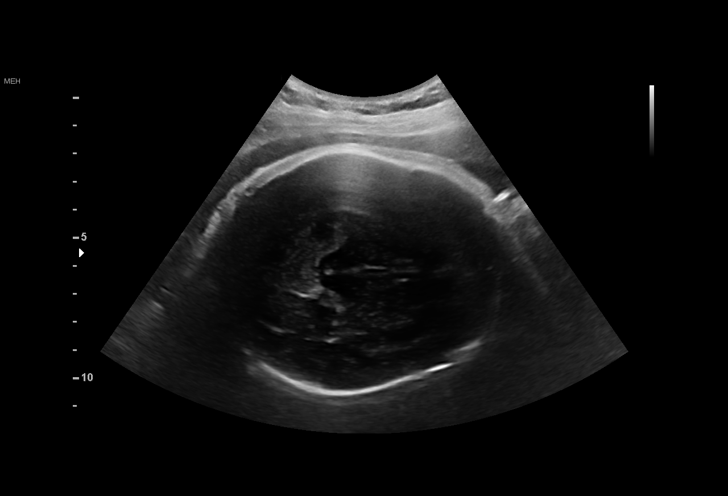
[im 11/99]
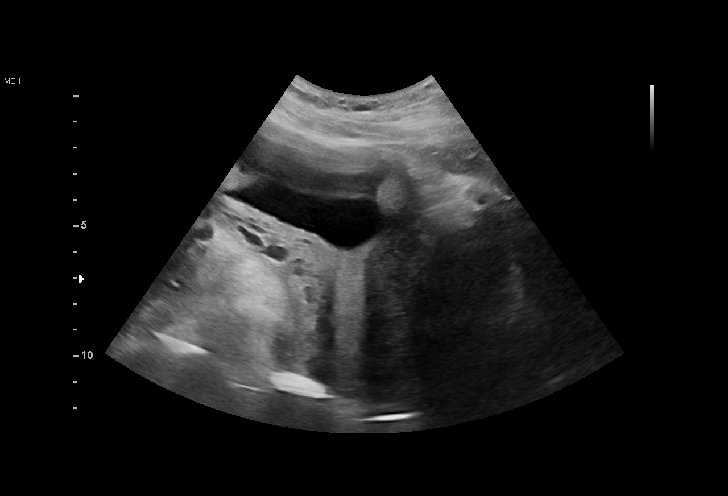
[im 19/99]
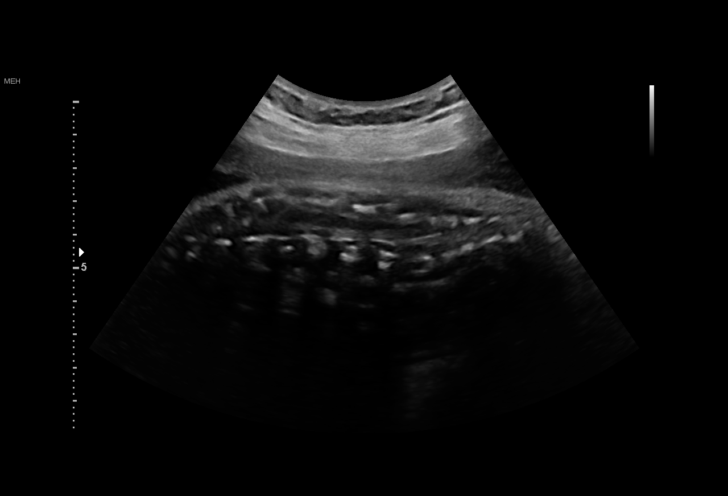
[im 26/99]
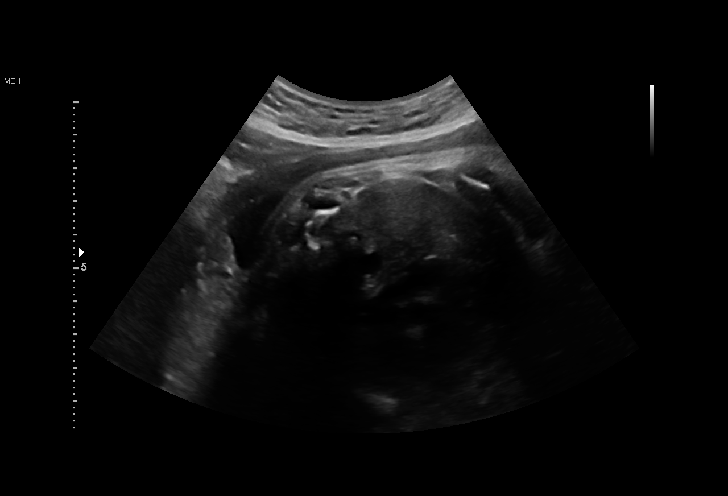
[im 33/99]
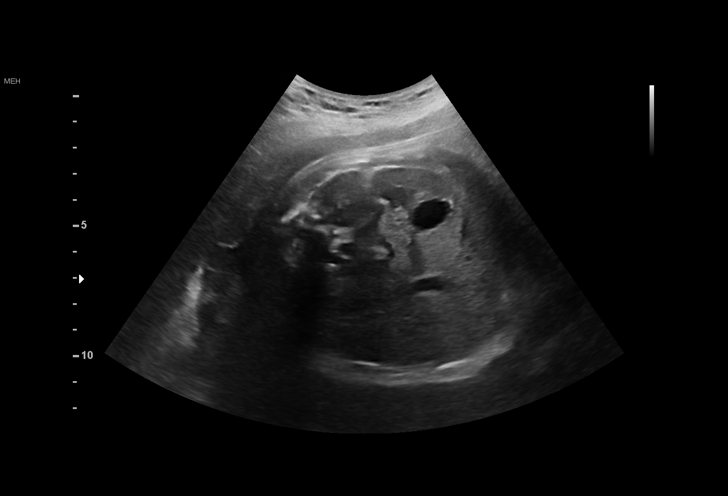
[im 40/99]
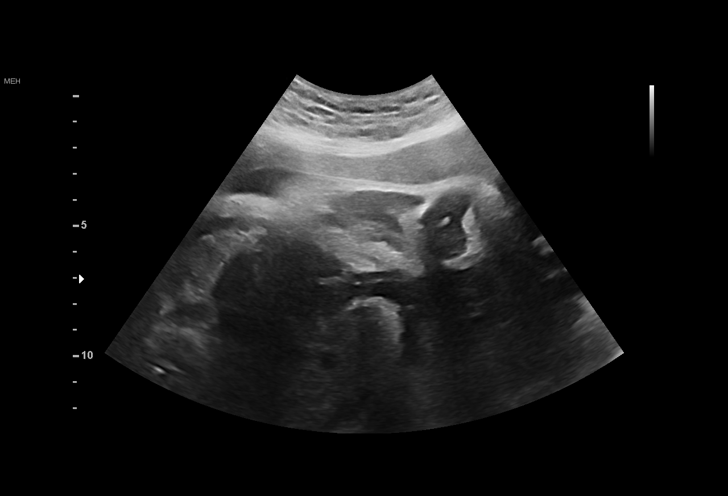
[im 48/99]
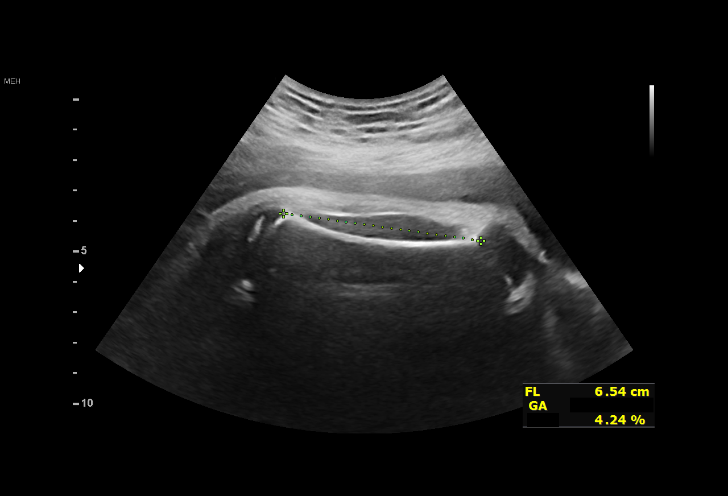
[im 55/99]
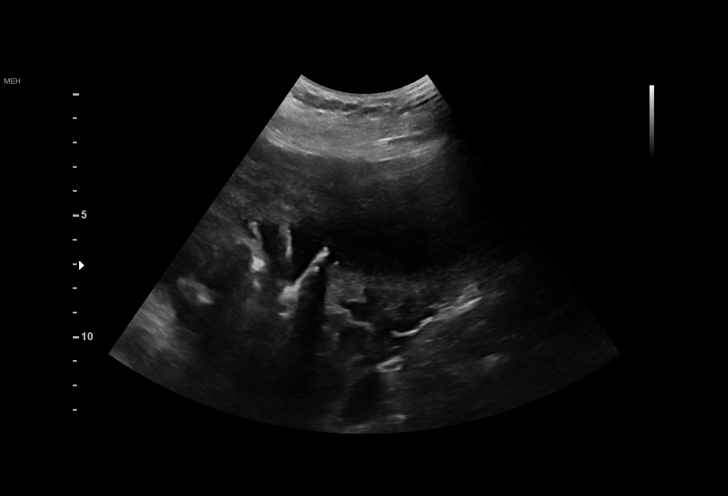
[im 62/99]
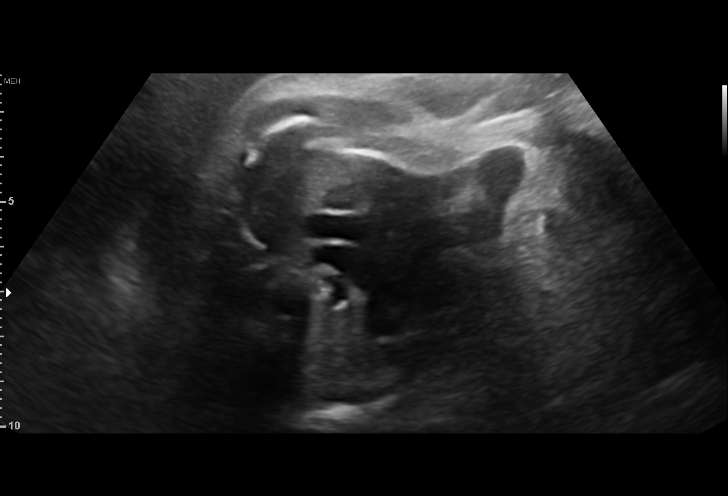
[im 69/99]
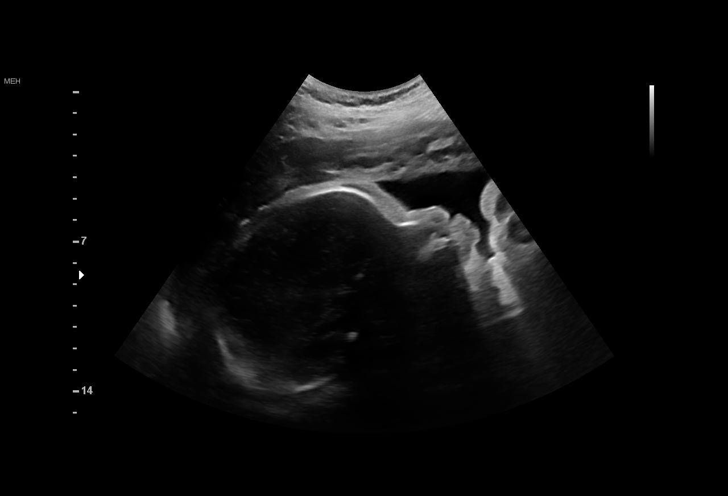
[im 77/99]
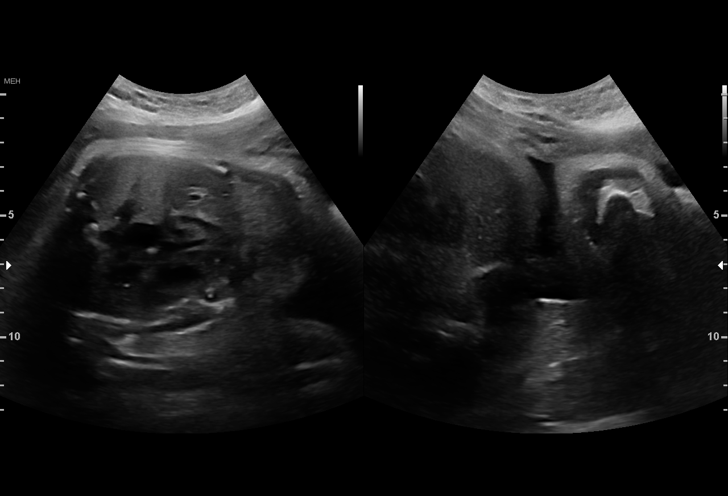
[im 84/99]
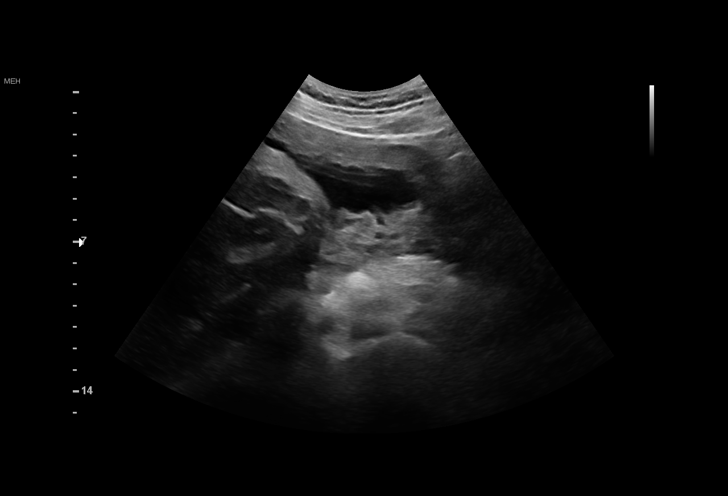
[im 91/99]
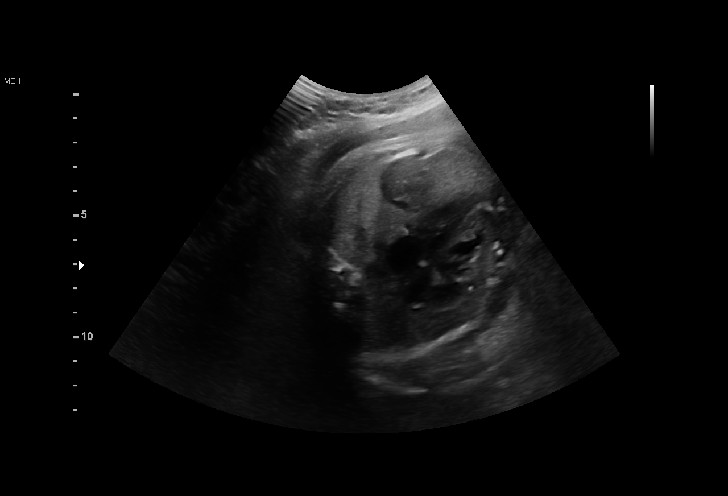
[im 99/99]
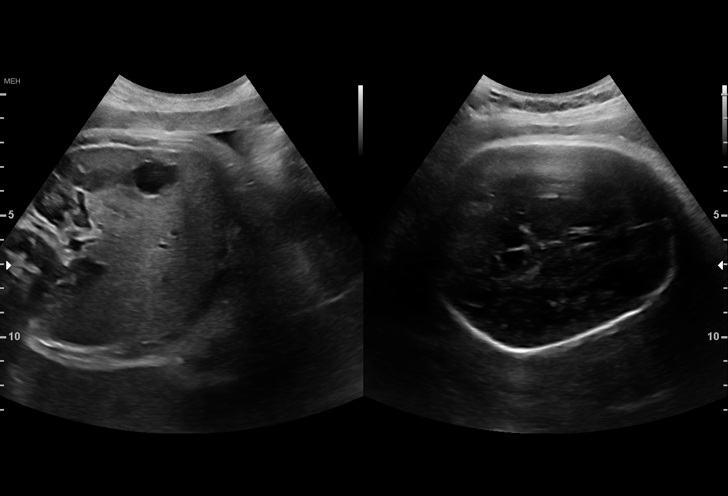

[14 of 28 positions shown; findings below may reference images not displayed]

[REDACTED]
                   REALITET

Indications

 36 weeks gestation of pregnancy
 Obesity
Fetal Evaluation

 Num Of Fetuses:         1
 Fetal Heart Rate(bpm):  135
 Cardiac Activity:       Observed
 Placenta:               Posterior Fundal

 Amniotic Fluid
 AFI FV:      Within normal limits

 AFI Sum(cm)     %Tile
 15.5            57
Biometry

 BPD:      85.6  mm     G. Age:  34w 4d         18  %    CI:        72.21   %    70 - 86
                                                         FL/HC:      20.4   %    20.1 -
 HC:      320.5  mm     G. Age:  36w 1d         23  %    HC/AC:      1.01        0.93 -
 AC:      317.1  mm     G. Age:  35w 4d         50  %    FL/BPD:     76.3   %    71 - 87
 FL:       65.3  mm     G. Age:  33w 5d          4  %    FL/AC:      20.6   %    20 - 24
 HUM:      57.6  mm     G. Age:  33w 3d         19  %
 Est. FW:    8244  gm    5 lb 11 oz      26  %
Gestational Age

 U/S Today:     35w 0d                                        EDD:   07/15/20
 Best:          36w 0d     Det. By:  Early Ultrasound         EDD:   07/08/20
                                     (01/23/20)
Anatomy

 Cranium:               Appears normal         Aortic Arch:            Appears normal
 Cavum:                 Appears normal         Ductal Arch:            Not well visualized
 Ventricles:            Appears normal         Diaphragm:              Appears normal
 Choroid Plexus:        Appears normal         Stomach:                Appears normal, left
                                                                       sided
 Cerebellum:            Appears normal         Abdomen:                Appears normal
 Posterior Fossa:       Appears normal         Abdominal Wall:         Appears nml (cord
                                                                       insert, abd wall)
 Nuchal Fold:           Not applicable (>20    Cord Vessels:           Appears normal (3
                        wks GA)                                        vessel cord)
 Face:                  Appears normal         Kidneys:                Appear normal
                        (orbits and profile)
 Lips:                  Appears normal         Bladder:                Appears normal
 Thoracic:              Appears normal         Spine:                  Appears normal
 Heart:                 Not well visualized    Upper Extremities:      Appears normal
 RVOT:                  Appears normal         Lower Extremities:      Appears normal
 LVOT:                  Appears normal
Impression

 Single intrauterine pregnancy here for a detailed anatomy
 due to elevated BMI
 Normal anatomy with measurements consistent with dates
 There is good fetal movement and amniotic fluid volume
 Suboptimal views of the fetal anatomy were obtained
 secondary to fetal position and advanced gestational age.

 I reiterated to Ms. Dallos that while the examination appeared
 normal, given the advanced gestational some fetal anomalies
 may not be well visualized.
Recommendations

 Follow up as clincally indicated.

## 2022-11-08 DIAGNOSIS — N921 Excessive and frequent menstruation with irregular cycle: Secondary | ICD-10-CM | POA: Diagnosis not present

## 2022-11-08 DIAGNOSIS — Z3202 Encounter for pregnancy test, result negative: Secondary | ICD-10-CM | POA: Diagnosis not present

## 2022-11-16 ENCOUNTER — Ambulatory Visit: Payer: Medicaid Other | Admitting: Family Medicine

## 2022-11-16 ENCOUNTER — Other Ambulatory Visit: Payer: Self-pay

## 2022-11-16 ENCOUNTER — Encounter: Payer: Self-pay | Admitting: Family Medicine

## 2022-11-16 VITALS — BP 112/75 | HR 79 | Ht 65.0 in | Wt 207.0 lb

## 2022-11-16 DIAGNOSIS — Z3009 Encounter for other general counseling and advice on contraception: Secondary | ICD-10-CM

## 2022-11-16 DIAGNOSIS — Z309 Encounter for contraceptive management, unspecified: Secondary | ICD-10-CM

## 2022-11-16 DIAGNOSIS — Z113 Encounter for screening for infections with a predominantly sexual mode of transmission: Secondary | ICD-10-CM

## 2022-11-16 DIAGNOSIS — R21 Rash and other nonspecific skin eruption: Secondary | ICD-10-CM

## 2022-11-16 DIAGNOSIS — N760 Acute vaginitis: Secondary | ICD-10-CM | POA: Insufficient documentation

## 2022-11-16 DIAGNOSIS — B9689 Other specified bacterial agents as the cause of diseases classified elsewhere: Secondary | ICD-10-CM | POA: Insufficient documentation

## 2022-11-16 DIAGNOSIS — Z9889 Other specified postprocedural states: Secondary | ICD-10-CM | POA: Insufficient documentation

## 2022-11-16 DIAGNOSIS — Z01419 Encounter for gynecological examination (general) (routine) without abnormal findings: Secondary | ICD-10-CM

## 2022-11-16 LAB — WET PREP FOR TRICH, YEAST, CLUE
Trichomonas Exam: NEGATIVE
Yeast Exam: NEGATIVE

## 2022-11-16 LAB — HM HIV SCREENING LAB: HM HIV Screening: NEGATIVE

## 2022-11-16 MED ORDER — NORETHINDRONE 0.35 MG PO TABS: 1.0000 | ORAL_TABLET | Freq: Every day | ORAL | 12 refills | Status: AC

## 2022-11-16 MED ORDER — METRONIDAZOLE 500 MG PO TABS: 500.0000 mg | ORAL_TABLET | Freq: Two times a day (BID) | ORAL | 0 refills | Status: AC

## 2022-11-16 NOTE — Progress Notes (Addendum)
 Pt is here for family planning visit.  Family planning packet reviewed and given to pt.  Wet prep results reviewed, treatment required per standing orders. Condoms declined. The patient was dispensed metronidazole today. I provided counseling today regarding the medication. We discussed the medication, the side effects and when to call clinic. Patient given the opportunity to ask questions. Questions answered.   Gaspar Garbe, RN

## 2022-11-16 NOTE — Progress Notes (Signed)
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
## 2022-11-17 LAB — HGB A1C W/O EAG: Hgb A1c MFr Bld: 5.1 % (ref 4.8–5.6)

## 2023-02-22 DIAGNOSIS — F411 Generalized anxiety disorder: Secondary | ICD-10-CM | POA: Diagnosis not present

## 2023-03-01 DIAGNOSIS — F411 Generalized anxiety disorder: Secondary | ICD-10-CM | POA: Diagnosis not present

## 2023-03-08 DIAGNOSIS — F411 Generalized anxiety disorder: Secondary | ICD-10-CM | POA: Diagnosis not present

## 2023-03-15 DIAGNOSIS — F411 Generalized anxiety disorder: Secondary | ICD-10-CM | POA: Diagnosis not present

## 2023-03-22 DIAGNOSIS — F411 Generalized anxiety disorder: Secondary | ICD-10-CM | POA: Diagnosis not present

## 2023-03-29 DIAGNOSIS — F411 Generalized anxiety disorder: Secondary | ICD-10-CM | POA: Diagnosis not present

## 2023-04-03 DIAGNOSIS — F411 Generalized anxiety disorder: Secondary | ICD-10-CM | POA: Diagnosis not present

## 2023-04-12 DIAGNOSIS — F411 Generalized anxiety disorder: Secondary | ICD-10-CM | POA: Diagnosis not present

## 2023-04-19 DIAGNOSIS — F411 Generalized anxiety disorder: Secondary | ICD-10-CM | POA: Diagnosis not present

## 2023-04-22 ENCOUNTER — Ambulatory Visit: Admitting: Nurse Practitioner

## 2023-04-22 DIAGNOSIS — T7421XA Adult sexual abuse, confirmed, initial encounter: Secondary | ICD-10-CM

## 2023-04-22 DIAGNOSIS — Z113 Encounter for screening for infections with a predominantly sexual mode of transmission: Secondary | ICD-10-CM

## 2023-04-22 DIAGNOSIS — Z3202 Encounter for pregnancy test, result negative: Secondary | ICD-10-CM

## 2023-04-22 DIAGNOSIS — Z8659 Personal history of other mental and behavioral disorders: Secondary | ICD-10-CM

## 2023-04-22 DIAGNOSIS — Z3009 Encounter for other general counseling and advice on contraception: Secondary | ICD-10-CM

## 2023-04-22 DIAGNOSIS — Z309 Encounter for contraceptive management, unspecified: Secondary | ICD-10-CM

## 2023-04-22 LAB — HEPATITIS B SURFACE ANTIGEN

## 2023-04-22 LAB — WET PREP FOR TRICH, YEAST, CLUE
Trichomonas Exam: NEGATIVE
Yeast Exam: NEGATIVE

## 2023-04-22 LAB — HM HEPATITIS C SCREENING LAB: HM Hepatitis Screen: NEGATIVE

## 2023-04-22 LAB — HM HIV SCREENING LAB: HM HIV Screening: NEGATIVE

## 2023-04-22 LAB — PREGNANCY, URINE: Preg Test, Ur: NEGATIVE

## 2023-04-22 NOTE — Progress Notes (Signed)
 Pt is here for STD screening , Wet prep results reviewed with pt, no treatment required per standing order. Condoms given. Sonda Primes, RN.

## 2023-04-24 ENCOUNTER — Encounter: Payer: Self-pay | Admitting: Nurse Practitioner

## 2023-04-24 NOTE — Progress Notes (Signed)
 Patient being seen in clinic for STI testing and is requesting UPT. This encounter is for UPT only.  See other encounter for STI visit.  Marylu Lund L. Genoveva Singleton, FNP-C
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
## 2023-04-26 DIAGNOSIS — F411 Generalized anxiety disorder: Secondary | ICD-10-CM | POA: Diagnosis not present

## 2023-05-03 DIAGNOSIS — F411 Generalized anxiety disorder: Secondary | ICD-10-CM | POA: Diagnosis not present

## 2023-05-05 ENCOUNTER — Telehealth: Payer: Self-pay | Admitting: Family Medicine

## 2023-05-05 NOTE — Telephone Encounter (Signed)
 See comments in the note box

## 2023-05-08 DIAGNOSIS — Z6830 Body mass index (BMI) 30.0-30.9, adult: Secondary | ICD-10-CM | POA: Diagnosis not present

## 2023-05-08 DIAGNOSIS — F320 Major depressive disorder, single episode, mild: Secondary | ICD-10-CM | POA: Diagnosis not present

## 2023-05-08 DIAGNOSIS — E669 Obesity, unspecified: Secondary | ICD-10-CM | POA: Diagnosis not present

## 2023-05-08 DIAGNOSIS — R030 Elevated blood-pressure reading, without diagnosis of hypertension: Secondary | ICD-10-CM | POA: Diagnosis not present

## 2023-05-08 DIAGNOSIS — F4310 Post-traumatic stress disorder, unspecified: Secondary | ICD-10-CM | POA: Diagnosis not present

## 2023-05-10 DIAGNOSIS — F411 Generalized anxiety disorder: Secondary | ICD-10-CM | POA: Diagnosis not present

## 2023-05-17 DIAGNOSIS — F411 Generalized anxiety disorder: Secondary | ICD-10-CM | POA: Diagnosis not present

## 2023-06-14 DIAGNOSIS — F411 Generalized anxiety disorder: Secondary | ICD-10-CM | POA: Diagnosis not present

## 2023-06-21 DIAGNOSIS — F411 Generalized anxiety disorder: Secondary | ICD-10-CM | POA: Diagnosis not present

## 2023-06-28 DIAGNOSIS — F411 Generalized anxiety disorder: Secondary | ICD-10-CM | POA: Diagnosis not present

## 2023-07-05 DIAGNOSIS — F411 Generalized anxiety disorder: Secondary | ICD-10-CM | POA: Diagnosis not present

## 2023-07-12 DIAGNOSIS — F411 Generalized anxiety disorder: Secondary | ICD-10-CM | POA: Diagnosis not present

## 2023-07-19 DIAGNOSIS — F411 Generalized anxiety disorder: Secondary | ICD-10-CM | POA: Diagnosis not present

## 2023-07-26 DIAGNOSIS — F411 Generalized anxiety disorder: Secondary | ICD-10-CM | POA: Diagnosis not present

## 2023-08-02 DIAGNOSIS — F411 Generalized anxiety disorder: Secondary | ICD-10-CM | POA: Diagnosis not present

## 2023-08-16 DIAGNOSIS — F411 Generalized anxiety disorder: Secondary | ICD-10-CM | POA: Diagnosis not present

## 2023-08-23 DIAGNOSIS — F411 Generalized anxiety disorder: Secondary | ICD-10-CM | POA: Diagnosis not present

## 2023-08-30 DIAGNOSIS — F411 Generalized anxiety disorder: Secondary | ICD-10-CM | POA: Diagnosis not present

## 2023-09-06 DIAGNOSIS — F411 Generalized anxiety disorder: Secondary | ICD-10-CM | POA: Diagnosis not present

## 2023-09-10 DIAGNOSIS — F411 Generalized anxiety disorder: Secondary | ICD-10-CM | POA: Diagnosis not present

## 2023-09-13 DIAGNOSIS — F411 Generalized anxiety disorder: Secondary | ICD-10-CM | POA: Diagnosis not present

## 2023-09-17 DIAGNOSIS — F411 Generalized anxiety disorder: Secondary | ICD-10-CM | POA: Diagnosis not present

## 2023-09-20 DIAGNOSIS — F411 Generalized anxiety disorder: Secondary | ICD-10-CM | POA: Diagnosis not present

## 2023-09-24 DIAGNOSIS — F411 Generalized anxiety disorder: Secondary | ICD-10-CM | POA: Diagnosis not present

## 2023-09-27 DIAGNOSIS — F411 Generalized anxiety disorder: Secondary | ICD-10-CM | POA: Diagnosis not present

## 2023-10-01 DIAGNOSIS — F411 Generalized anxiety disorder: Secondary | ICD-10-CM | POA: Diagnosis not present

## 2023-10-04 DIAGNOSIS — F411 Generalized anxiety disorder: Secondary | ICD-10-CM | POA: Diagnosis not present

## 2023-10-08 DIAGNOSIS — F411 Generalized anxiety disorder: Secondary | ICD-10-CM | POA: Diagnosis not present

## 2023-10-11 DIAGNOSIS — F411 Generalized anxiety disorder: Secondary | ICD-10-CM | POA: Diagnosis not present

## 2023-10-13 ENCOUNTER — Ambulatory Visit

## 2023-10-13 ENCOUNTER — Telehealth: Payer: Self-pay | Admitting: Family Medicine

## 2023-10-13 DIAGNOSIS — Z113 Encounter for screening for infections with a predominantly sexual mode of transmission: Secondary | ICD-10-CM

## 2023-10-13 LAB — WET PREP FOR TRICH, YEAST, CLUE
Clue Cell Exam: POSITIVE — AB
Trichomonas Exam: NEGATIVE
Yeast Exam: NEGATIVE

## 2023-10-13 LAB — HM HIV SCREENING LAB: HM HIV Screening: NEGATIVE
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
## 2023-10-13 NOTE — Progress Notes (Signed)
 Pt is here for std screening, wet prep results reviewed with pt no treatment required per standing order. Condoms given. Caren Channel, RN

## 2023-10-15 DIAGNOSIS — F411 Generalized anxiety disorder: Secondary | ICD-10-CM | POA: Diagnosis not present

## 2023-10-18 DIAGNOSIS — F411 Generalized anxiety disorder: Secondary | ICD-10-CM | POA: Diagnosis not present

## 2023-10-18 LAB — GONOCOCCUS CULTURE

## 2023-10-22 DIAGNOSIS — F411 Generalized anxiety disorder: Secondary | ICD-10-CM | POA: Diagnosis not present

## 2023-10-25 DIAGNOSIS — F411 Generalized anxiety disorder: Secondary | ICD-10-CM | POA: Diagnosis not present

## 2023-10-27 ENCOUNTER — Telehealth: Payer: Self-pay | Admitting: Family Medicine

## 2023-10-29 DIAGNOSIS — F411 Generalized anxiety disorder: Secondary | ICD-10-CM | POA: Diagnosis not present

## 2023-11-01 DIAGNOSIS — F411 Generalized anxiety disorder: Secondary | ICD-10-CM | POA: Diagnosis not present

## 2023-11-05 DIAGNOSIS — F411 Generalized anxiety disorder: Secondary | ICD-10-CM | POA: Diagnosis not present

## 2023-11-08 DIAGNOSIS — F411 Generalized anxiety disorder: Secondary | ICD-10-CM | POA: Diagnosis not present

## 2023-11-12 DIAGNOSIS — F411 Generalized anxiety disorder: Secondary | ICD-10-CM | POA: Diagnosis not present

## 2023-11-22 DIAGNOSIS — F411 Generalized anxiety disorder: Secondary | ICD-10-CM | POA: Diagnosis not present

## 2023-11-29 DIAGNOSIS — F411 Generalized anxiety disorder: Secondary | ICD-10-CM | POA: Diagnosis not present

## 2023-12-06 DIAGNOSIS — F411 Generalized anxiety disorder: Secondary | ICD-10-CM | POA: Diagnosis not present

## 2023-12-13 DIAGNOSIS — F411 Generalized anxiety disorder: Secondary | ICD-10-CM | POA: Diagnosis not present

## 2023-12-20 DIAGNOSIS — F411 Generalized anxiety disorder: Secondary | ICD-10-CM | POA: Diagnosis not present

## 2023-12-27 DIAGNOSIS — F411 Generalized anxiety disorder: Secondary | ICD-10-CM | POA: Diagnosis not present

## 2024-01-03 DIAGNOSIS — F411 Generalized anxiety disorder: Secondary | ICD-10-CM | POA: Diagnosis not present

## 2024-01-17 DIAGNOSIS — F411 Generalized anxiety disorder: Secondary | ICD-10-CM | POA: Diagnosis not present

## 2024-01-24 DIAGNOSIS — F411 Generalized anxiety disorder: Secondary | ICD-10-CM | POA: Diagnosis not present
